# Patient Record
Sex: Male | Born: 1965 | Race: Black or African American | Hispanic: No | Marital: Married | Smoking: Current some day smoker
Health system: Southern US, Community
[De-identification: ages and names within clinical notes are randomized; demographics above are authoritative.]

## PROBLEM LIST (undated history)

## (undated) DIAGNOSIS — J45909 Unspecified asthma, uncomplicated: Secondary | ICD-10-CM

## (undated) DIAGNOSIS — K219 Gastro-esophageal reflux disease without esophagitis: Secondary | ICD-10-CM

## (undated) DIAGNOSIS — I10 Essential (primary) hypertension: Secondary | ICD-10-CM

## (undated) DIAGNOSIS — M1990 Unspecified osteoarthritis, unspecified site: Secondary | ICD-10-CM

## (undated) DIAGNOSIS — G8929 Other chronic pain: Secondary | ICD-10-CM

## (undated) DIAGNOSIS — M549 Dorsalgia, unspecified: Secondary | ICD-10-CM

## (undated) HISTORY — PX: KNEE ARTHROSCOPY: SUR90

## (undated) HISTORY — PX: COLONOSCOPY: SHX174

## (undated) HISTORY — PX: ROTATOR CUFF REPAIR: SHX139

## (undated) HISTORY — PX: BACK SURGERY: SHX140

---

## 2008-10-20 ENCOUNTER — Encounter: Admission: RE | Admit: 2008-10-20 | Discharge: 2008-10-20 | Payer: Self-pay | Admitting: Neurosurgery

## 2009-01-07 ENCOUNTER — Ambulatory Visit: Payer: Self-pay | Admitting: Vascular Surgery

## 2009-01-07 ENCOUNTER — Inpatient Hospital Stay (HOSPITAL_COMMUNITY): Admission: RE | Admit: 2009-01-07 | Discharge: 2009-01-11 | Payer: Self-pay | Admitting: Neurosurgery

## 2010-08-02 IMAGING — CR DG OR LOCAL ABDOMEN
1 series · 1 of 1 positions shown · non-contrast
Comparison: None

CLINICAL DATA: Anterior lumbar fusion.  No instrument, performed.

OR LOCAL ABDOMEN

[AP]
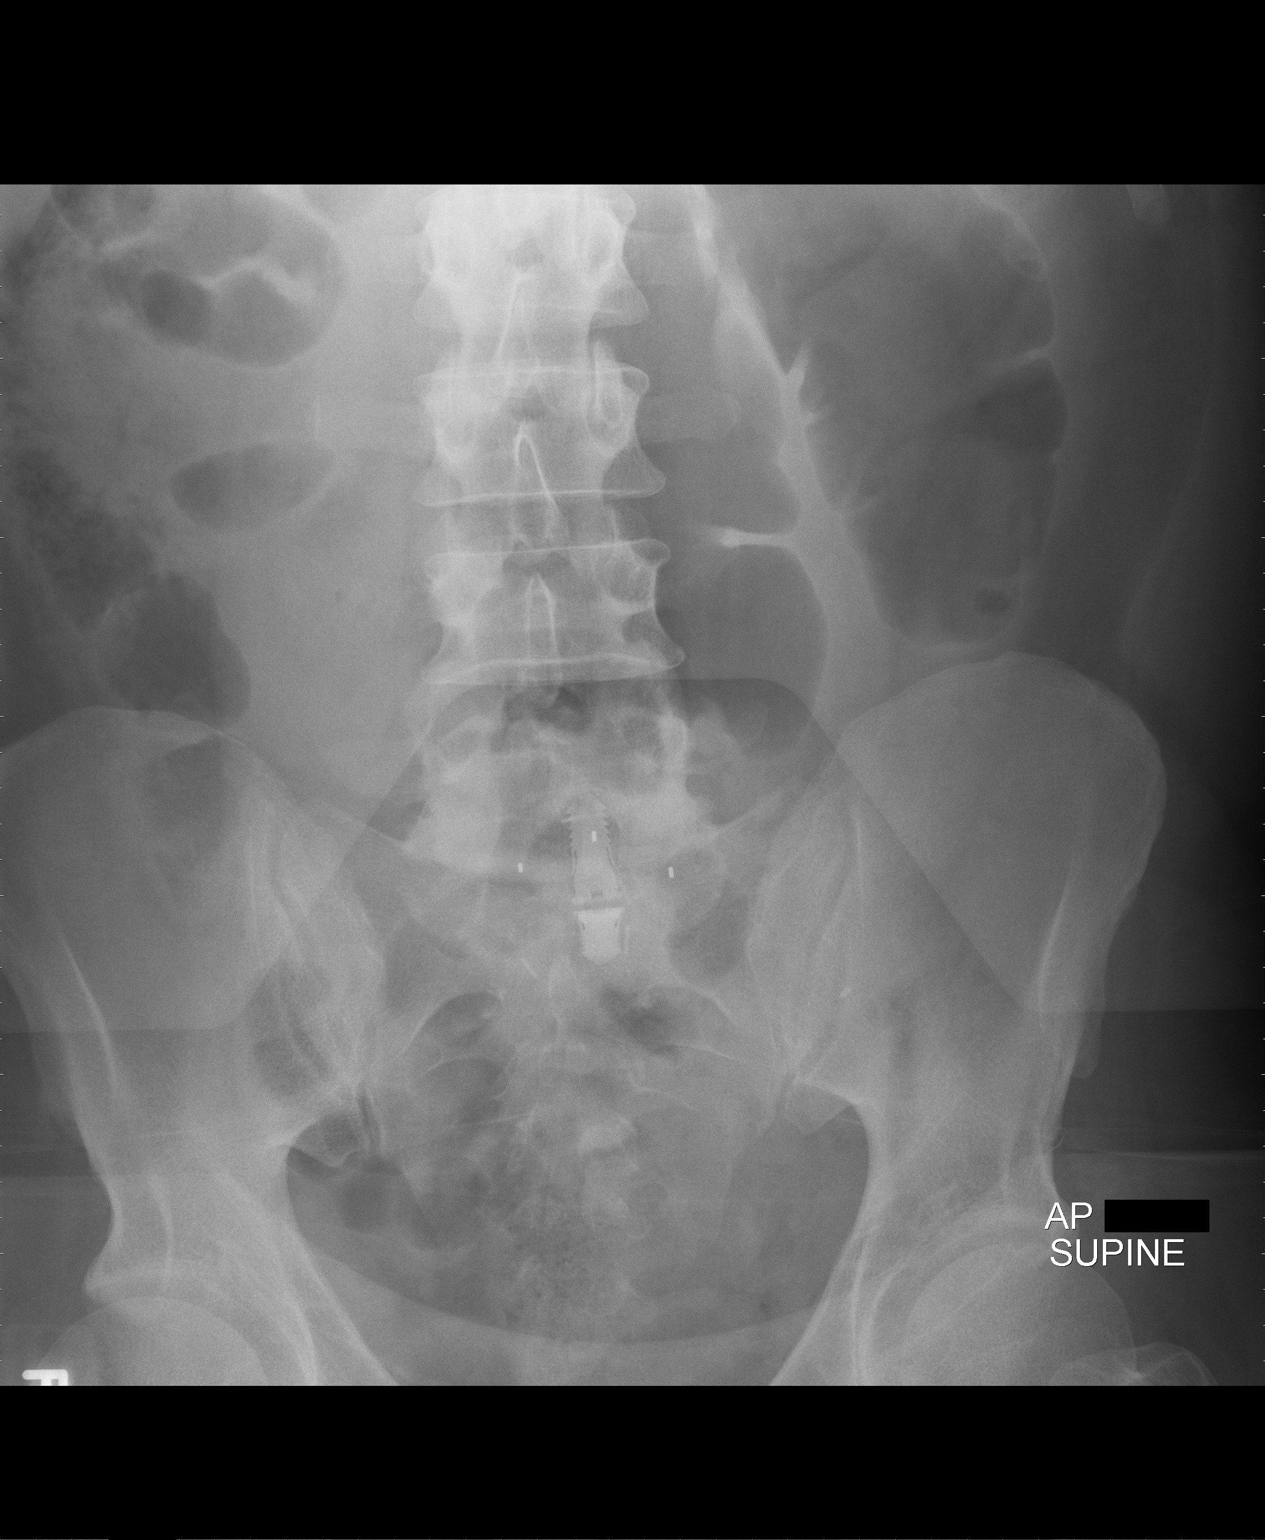

[1 of 1 positions shown; findings below may reference images not displayed]

FINDINGS: An GUTTILA PARLINA type lumbar fixation plate is present at the
L5-S1 level, with several vascular clips in the vicinity.

No radiopaque sponge marker or abnormal unexpected instrument is
identified within the visualized fields.
IMPRESSION: 1.  No unexpected foreign body identified.
2.  Anterior fusion at L5-S1.

## 2011-04-10 LAB — TYPE AND SCREEN
ABO/RH(D): O POS
Antibody Screen: NEGATIVE

## 2011-04-10 LAB — CBC
HCT: 45.8 % (ref 39.0–52.0)
MCV: 95.1 fL (ref 78.0–100.0)
Platelets: 241 10*3/uL (ref 150–400)

## 2011-05-09 NOTE — Op Note (Signed)
NAMEMALIKAI, GUT            ACCOUNT NO.:  [id number]   MEDICAL RECORD NO.:  [id number]          PATIENT TYPE:  INP   LOCATION:  3001                         FACILITY:  [facility]   PHYSICIAN:  Danae Orleans. Venetia Maxon, M.D.  DATE OF BIRTH:  1966/10/30   DATE OF PROCEDURE:  01/07/2009  DATE OF DISCHARGE:                               OPERATIVE REPORT   PREOPERATIVE DIAGNOSES:  Degenerative disk disease, spondylosis,  herniated lumbar disk, lumbar radiculopathy at L5-S1 level.   POSTOPERATIVE DIAGNOSES:  Degenerative disk disease, spondylosis,  herniated lumbar disk, lumbar radiculopathy at L5-S1 level.   PROCEDURE:  L5-S1 diskectomy, anterior lumbar interbody fusion with  interbody cage, plates, EquivaBone, and bone morphogenetic protein.   SURGEON:  Danae Orleans. Venetia Maxon, MD   ASSISTANT:  Georgiann Cocker, RN and Larina Earthly, MD   ANESTHESIA:  General endotracheal anesthesia.   ESTIMATED BLOOD LOSS:  Minimal.   COMPLICATIONS:  None.   DISPOSITION:  Recovery.   INDICATIONS:  Ricardo Yoder is a 45 year old man with a recurrent  disk herniation and significant disk degeneration at the L5-S1 level.  He has had three prior laminectomies at L5-S1 on the right.  He has  persistent right leg pain, disk space collapse, and significant  degenerative change at the L5-S1 level and he complains of significant  back pain greater than leg pain.  It was elected to take him to surgery  for anterior lumbar interbody fusion at the L5-S1 level.  Dr. Arbie Cookey was  doing opening and we will dictate his the approach for the surgery  separately.   PROCEDURE:  After Dr. Arbie Cookey had gained access to the anterior lumbar  interbody spine and placed the self-retaining retractors (I assisted him  throughout the opening from skin incision to placement of retractors).  The L5-S1 level was identified.  An 18 gauge spinal needle was inserted  and intraoperative x-ray demonstrated correct level and also appropriate  space  positioning in terms of the AP projection.  The interspace at L5-  S1 was then incised and using Cobb disk dissector, the entire disk was  removed.  The endplates were stripped of residual disk material using  sequential sizers and a variety of curettes and pituitary rongeurs and a  Kerrison rongeurs, the spinal canal was decompressed and posteriorly  projecting osteophytes were also removed.  Intraoperative x-ray with C-  arm demonstrated a hook posterior to the inferior S1 endplate and after  confirming with C-arm fluoroscopy, it was felt that the spinal canal and  both nerve roots were well decompressed and the posterior disk material  had been removed satisfactorily.  There was a prominence of the L5  endplate, which was shaved down with high-speed drill and after trial  sizing a 33 x 36 x 10 degrees x 12 mm in height, trial prosthesis was  inserted and tamped into position countersunk appropriately.  Positioning was confirmed on AP and lateral fluoroscopy and it was felt  that this spacer appeared to fit the interspace well and result in  appropriate level of endplate distraction.  Subsequently, a similarly  sized final implant was then selected,  packed with extra small BMP and  10 mL of EquivaBone, this was then inserted and countersunk  appropriately, positioning was confirmed on AP and lateral fluoroscopy.  Medium plates were then inserted one at L5, one at S1.  All plates were  well positioned and fully engaged.  The final x-ray both lateral and AP  projections demonstrated well-positioned interbody graft.  The  additional remaining EquivaBone was then inserted and countersunk  appropriately overlying the implant, which appeared to be well  positioned.  The retractors were then removed and Dr. Arbie Cookey returned for  closure and I assisted him with the closure.  This was done with 0  Vicryl running suture, reapproximating the anterior rectus sheath and a  subcuticular 3-0 Vicryl  stitch.  The wound was dressed with Dermabond.  The patient was extubated in the operating room and taken to recovery in  stable and satisfactory condition having tolerated the operation well.  Counts were correct at the end of the case.  A final x-ray was obtained  demonstrating no evidence of any retained foreign body.      Danae Orleans. Venetia Maxon, M.D.  Electronically Signed     JDS/MEDQ  D:  01/07/2009  T:  01/08/2009  Job:  [id number]

## 2011-05-09 NOTE — Op Note (Signed)
Ricardo Yoder, Ricardo Yoder            ACCOUNT NO.:  [id number]   MEDICAL RECORD NO.:  [id number]          PATIENT TYPE:  INP   LOCATION:  3001                         FACILITY:  [facility]   PHYSICIAN:  Larina Earthly, M.D.    DATE OF BIRTH:  [birth year]   DATE OF PROCEDURE:  01/07/2009  DATE OF DISCHARGE:                               OPERATIVE REPORT   PREOPERATIVE DIAGNOSIS:  L5-S1 disk disease.   POSTOPERATIVE DIAGNOSIS:  L5-S1 disk disease.   PROCEDURE:  Anterior exposure for anterior lumbar interbody fusion.   SURGEON:  Larina Earthly, MD   ASSISTANT:  Danae Orleans. Venetia Maxon, MD   ANESTHESIA:  General endotracheal.   COMPLICATIONS:  None.   DISPOSITION:  The patient will have remainder of the procedure dictated  by Dr. Venetia Maxon for the ALIF and the patient was transferred to recovery  room stable.   INDICATIONS:  The patient is a 45 year old gentleman with progressive  severe degenerative disk disease.  He was seen by Dr. Maeola Harman, who  recommended interbody fusion of L5-S1 as to provide anterior exposure.  I discussed this with [name] preoperatively in the holding  area.  I explained my role for mobilization of the vessels and ureter.  I discussed potential injuries to include arterial, venous, ureteral  injury.  Also, discussed the possible chance of retrograde ejaculation  and erectile dysfunction.  The patient understands and wish to proceed  with surgery.   PROCEDURE IN DETAIL:  The patient was taken to the operating room,  placed in supine position, where the area of the abdomen prepped and  draped in the usual sterile fashion.  With the C-arm in the lateral  position, the level of the L5-S1 disk space was marked on the skin.  An  incision was made transversely in the lower abdomen from the midline  lateral to the left.  This was continued deep through the subcutaneous  fat down to the level of the fascia.  The fat was mobilized off the  fascia and the anterior  rectus sheath was opened just to the left of  midline longitudinally with electrocautery.  The rectus muscle was  mobilized to the left.  The retroperitoneal space was entered below the  semilunar line with mobilization of the intraperitoneal contents to the  right.  The posterior rectus sheath was divided above the level of the  semilunar line laterally.  The ureter was identified and was mobilized  to the right as well.  The left iliac artery and left iliac vein were  mobilized.  Middle sacral vessels were clipped and divided.  This did  give exposure between the bifurcation of the iliac vessels for the L5-S1  disk space.  The tissue above the disk was mobilized to allow placement  of the retractor.  Once adequate mobilization was obtained, the Brau  retractor was secured to the bed and the 150 blades were positioned to  the right into the left of the L5-S1 body.  The diskectomy and interbody  fusion will be dictated as a separate note.  After completion of this,  the retractors  were removed.  The vessels and intraperitoneal contents  were again visualized.  There was no bleeding and no evidence of injury.  The peritoneal contents were allowed to fall back into the pelvis.  The  anterior rectus sheath was closed with a running 0 Vicryl suture  beginning proximally and distally and tying in the middle.  Wound was  irrigated with saline and hemostasis with electrocautery.  The skin was  closed with 3-0 subcuticular Vicryl stitch.  Dermabond was positioned  over the suture line.  The patient tolerated the procedure well.  No  immediate complication.  There had been a continuous monitoring of his  pulse in his foot and he had normal pulse at the completion of the  operation.      Larina Earthly, M.D.  Electronically Signed     TFE/MEDQ  D:  01/08/2009  T:  01/09/2009  Job:  [id number]   cc:   Danae Orleans. Venetia Maxon, M.D.

## 2011-05-12 NOTE — Discharge Summary (Signed)
Ricardo Yoder            ACCOUNT NO.:  [id number]   MEDICAL RECORD NO.:  [id number]          PATIENT TYPE:  INP   LOCATION:  3001                         FACILITY:  [facility]   PHYSICIAN:  Danae Orleans. Venetia Maxon, M.D.  DATE OF BIRTH:  [birth year]   DATE OF ADMISSION:  01/07/2009  DATE OF DISCHARGE:  01/11/2009                               DISCHARGE SUMMARY   REASON FOR ADMISSION:  Lumbosacral spondylosis, lumbosacral disk  degeneration, lumbar disk herniation, cellulitis of the trunk,  prophylactic vaccination against Strep pneumoniae, unspecified  constipation, tobacco use disorder, chronic use of steroids.   FINAL DIAGNOSES:  1. Lumbosacral spondylosis.  2. Lumbosacral disk degeneration.  3. Lumbar disk herniation.  4. Cellulitis of the trunk.  5. Prophylactic vaccination against Streptococcus pneumoniae.  6. Unspecified constipation.  7. Tobacco use disorder.  8. Chronic use of steroids.   HISTORY OF PRESENT ILLNESS AND HOSPITAL COURSE:  Ricardo Yoder is a  45 year old disabled man with low back pain and lumbar disk degeneration  at L5-S1.  He has a lateral disk protrusion associated with significant  degenerative changes at the L5-S1 level with extraforaminal and  foraminal right L5 nerve root encroachment.  He previously had lumbar  diskectomies at L5-S1 in 2002 and 2004.  It was elected to take him to  surgery for anterior lumbar decompression and fusion at the L5-S1 level.  This was done in conjunction with Dr. Arbie Cookey who provided exposure from  vascular surgery standpoint.  The patient did well after the surgery.  He was complaining of abdominal cramping, and he was found to have some  redness about his incision and was started on oral Keflex by Dr.  Franky Macho.  Subsequently, he was doing better with less pain and erythema  around the incision and was discharged home in stable satisfactory  condition having tolerated the operation and hospitalization well with  discharge  medications of Percocet and Valium with subcutaneous Lovenox  postoperatively.  Followup in 3 weeks in the office.      Danae Orleans. Venetia Maxon, M.D.  Electronically Signed     Danae Orleans. Venetia Maxon, M.D.  Electronically Signed    JDS/MEDQ  D:  04/01/2009  T:  04/02/2009  Job:  [id number]

## 2016-08-08 ENCOUNTER — Other Ambulatory Visit: Payer: Self-pay | Admitting: Neurosurgery

## 2016-08-30 ENCOUNTER — Encounter (HOSPITAL_COMMUNITY)
Admission: RE | Admit: 2016-08-30 | Discharge: 2016-08-30 | Disposition: A | Discharge status: Home / Self Care | Payer: Medicare Other | Source: Ambulatory Visit | Attending: Neurosurgery | Admitting: Neurosurgery

## 2016-08-30 ENCOUNTER — Encounter (HOSPITAL_COMMUNITY): Payer: Self-pay

## 2016-08-30 HISTORY — DX: Unspecified osteoarthritis, unspecified site: M19.90

## 2016-08-30 HISTORY — DX: Dorsalgia, unspecified: M54.9

## 2016-08-30 HISTORY — DX: Unspecified asthma, uncomplicated: J45.909

## 2016-08-30 HISTORY — DX: Other chronic pain: G89.29

## 2016-08-30 HISTORY — DX: Gastro-esophageal reflux disease without esophagitis: K21.9

## 2016-08-30 HISTORY — DX: Essential (primary) hypertension: I10

## 2016-08-30 LAB — BASIC METABOLIC PANEL WITH GFR
Anion gap: 6 (ref 5–15)
BUN: 13 mg/dL (ref 6–20)
CO2: 25 mmol/L (ref 22–32)
Calcium: 9.6 mg/dL (ref 8.9–10.3)
Chloride: 108 mmol/L (ref 101–111)
Creatinine, Ser: 1.28 mg/dL — ABNORMAL HIGH (ref 0.61–1.24)
GFR calc Af Amer: >60 mL/min
GFR calc non Af Amer: >60 mL/min
Glucose, Bld: 103 mg/dL — ABNORMAL HIGH (ref 65–99)
Potassium: 3.9 mmol/L (ref 3.5–5.1)
Sodium: 139 mmol/L (ref 135–145)

## 2016-08-30 LAB — SURGICAL PCR SCREEN
MRSA, PCR: NEGATIVE
Staphylococcus aureus: POSITIVE — AB

## 2016-08-30 LAB — TYPE AND SCREEN
ABO/RH(D): O POS
Antibody Screen: NEGATIVE

## 2016-08-30 LAB — CBC
HCT: 48.1 % (ref 39.0–52.0)
Hemoglobin: 16.3 g/dL (ref 13.0–17.0)
MCH: 32.3 pg (ref 26.0–34.0)
MCHC: 33.9 g/dL (ref 30.0–36.0)
MCV: 95.2 fL (ref 78.0–100.0)
Platelets: 231 10*3/uL (ref 150–400)
RBC: 5.05 MIL/uL (ref 4.22–5.81)
RDW: 13.1 % (ref 11.5–15.5)
WBC: 11.2 10*3/uL — ABNORMAL HIGH (ref 4.0–10.5)

## 2016-08-30 NOTE — Pre-Procedure Instructions (Addendum)
Ricardo Yoder  08/30/2016     Your procedure is scheduled on Friday, September 01, 2016 at 2:00 PM.   Report to [facility] Entrance "A" Admitting Office at 11:00 AM.   Call this number if you have problems the morning of surgery: [phone]   Questions prior to day of surgery, please call [phone] between 8 & 4 PM.   Remember:  Do not eat food or drink liquids after midnight Thursday, 08/31/16.  Take these medicines the morning of surgery with A SIP OF WATER: Amlodipine (Norvasc), Omeprazole (Prilosec), Oxycodone - if needed.   Do not wear jewelry.  Do not wear lotions, powders, or cologne.  Men may shave face and neck.  Do not bring valuables to the hospital.  [facility] is not responsible for any belongings or valuables.  Contacts, dentures or bridgework may not be worn into surgery.  Leave your suitcase in the car.  After surgery it may be brought to your room.  For patients admitted to the hospital, discharge time will be determined by your treatment team.  Special instructions:  [place] - Preparing for Surgery  Before surgery, you can play an important role.  Because skin is not sterile, your skin needs to be as free of germs as possible.  You can reduce the number of germs on you skin by washing with CHG (chlorahexidine gluconate) soap before surgery.  CHG is an antiseptic cleaner which kills germs and bonds with the skin to continue killing germs even after washing.  Please DO NOT use if you have an allergy to CHG or antibacterial soaps.  If your skin becomes reddened/irritated stop using the CHG and inform your nurse when you arrive at Short Stay.  Do not shave (including legs and underarms) for at least 48 hours prior to the first CHG shower.  You may shave your face.  Please follow these instructions carefully:   1.  Shower with CHG Soap the night before surgery and the                    morning of Surgery.  2.  If you choose to wash your hair,  wash your hair first as usual with your       normal shampoo.  3.  After you shampoo, rinse your hair and body thoroughly to remove the shampoo.  4.  Use CHG as you would any other liquid soap.  You can apply chg directly       to the skin and wash gently with scrungie or a clean washcloth.  5.  Apply the CHG Soap to your body ONLY FROM THE NECK DOWN.        Do not use on open wounds or open sores.  Avoid contact with your eyes, ears, mouth and genitals (private parts).  Wash genitals (private parts) with your normal soap.  6.  Wash thoroughly, paying special attention to the area where your surgery        will be performed.  7.  Thoroughly rinse your body with warm water from the neck down.  8.  DO NOT shower/wash with your normal soap after using and rinsing off       the CHG Soap.  9.  Pat yourself dry with a clean towel.            10.  Wear clean pajamas.            11.  Place clean sheets  on your bed the night of your first shower and do not        sleep with pets.  Day of Surgery  Do not apply any lotions the morning of surgery.  Please wear clean clothes to the hospital.   Please read over the following fact sheets that you were given. Pain Booklet, Coughing and Deep Breathing, MRSA Information and Surgical Site Infection Prevention

## 2016-08-30 NOTE — Progress Notes (Signed)
Pt denies cardiac history, chest pain or sob. 

## 2016-08-30 NOTE — Progress Notes (Signed)
   08/30/16 [id number]  OBSTRUCTIVE SLEEP APNEA  Have you ever been diagnosed with sleep apnea through a sleep study? No  Do you snore loudly (loud enough to be heard through closed doors)?  1  Do you often feel tired, fatigued, or sleepy during the daytime (such as falling asleep during driving or talking to someone)? 0  Has anyone observed you stop breathing during your sleep? 1  Do you have, or are you being treated for high blood pressure? 1  BMI more than 35 kg/m2? 0  Age > 50 (1-yes) 1  Neck circumference greater than:Male 16 inches or larger, Male 17inches or larger? 1  Male Gender (Yes=1) 1  Obstructive Sleep Apnea Score 6  Score 5 or greater  Results sent to PCP

## 2016-08-30 NOTE — Progress Notes (Signed)
Mupirocin Ointment Rx called into [facility] for positive PCR of Staph. Left message on pt's voicemail informing him of results and need to pick up Rx.

## 2016-08-31 MED ORDER — CEFAZOLIN SODIUM-DEXTROSE 2-4 GM/100ML-% IV SOLN
2.0000 g | INTRAVENOUS | Status: AC
  Administered 2016-09-01: 3 g via INTRAVENOUS
  Filled 2016-08-31: qty 100

## 2016-08-31 NOTE — H&P (Signed)
Patient ID:   [id number] Patient: Ricardo Dissobert Naas  Date of Birth: 1966/02/26 Visit Type: Office Visit   Date: 08/07/2016 09:30 AM Provider: Danae OrleansJoseph D. Venetia MaxonStern MD   This 50 year old male presents for back pain.  History of Present Illness: 1.  back pain    03/23/16 ESI L3-L4 by Dr. Ollen BowlHarkins  Patient reports no pain relief after injection.  Reports persistent low back and left leg numbness and pain with increased right leg numbness and pain. Poor sleep. He continues to smoke 5-6 cigarettes a day and has gained weight since his last visit.   Percocet 10/325 increased recently by patient to every 4-6 hours due to increased pain  (prescribed by Dr. Mayford KnifeWilliams typically #90 per month)      Medical/Surgical/Interim History Reviewed, no change.  Last detailed document date:01/31/2016.   PAST MEDICAL HISTORY, SURGICAL HISTORY, FAMILY HISTORY, SOCIAL HISTORY AND REVIEW OF SYSTEMS  03/23/2016, which I have signed.  Family History: Reviewed, no changes.  Last detailed document: 01/31/2016.   Social History: Tobacco use reviewed. Reviewed, no changes. Last detailed document date: 01/31/2016.      MEDICATIONS(added, continued or stopped this visit): Started Medication Directions Instruction Stopped   amlodipine 10 mg tablet take 1 tablet by oral route  every day     omeprazole 40 mg capsule,delayed release take 1 capsule by oral route  every day before a meal     Percocet 10 mg-325 mg tablet take 1 tablet by oral route  every 8 hours as needed       ALLERGIES: Ingredient Reaction Medication Name Comment  NO KNOWN ALLERGIES     No known allergies.    Vitals Date Temp F BP Pulse Ht In Wt Lb BMI BSA Pain Score  08/07/2016  133/89 80 74 259 33.25  10/10      IMPRESSION The patient did not get any relief from his epidural injection and continues to have low back and bilateral leg pain/numbness. Since the injection offered no relief and he is in miserable pain, the only other  option at this point is surgery. I recommend a L3-4, L4-5 MAS PLIF for alleviation of his low back and bilateral leg symptoms.  Completed Orders (this encounter) Order Details Reason Side Interpretation Result Initial Treatment Date Region  Dietary management education, guidance, and counseling Encouraged to eat a well balanced diet and follow up with primary care physician.         Assessment/Plan # Detail Type Description   1. Assessment Body mass index (BMI) 33.0-33.9, adult (F62.13(Z68.33).   Plan Orders Today's instructions / counseling include(s) Dietary management education, guidance, and counseling.       2. Assessment Spondylolisthesis, lumbar region (M43.16).   Plan Orders Aspen Lo Sag Rigid Panel Quick.       3. Assessment Radiculopathy, lumbar region (M54.16).       4. Assessment Spinal stenosis of lumbar region (M48.06).         Pain Assessment/Treatment Pain Scale: 10/10. Method: Numeric Pain Intensity Scale. Location: back, legs. Onset: 05/31/2015. Duration: varies. Quality: discomforting. Pain Assessment/Treatment follow-up plan of care: Patient taking medications as prescribed..  Fall Risk Plan The patient has not fallen in the last year.  We discussed smoking cessation and the importance of weight loss. Schedule L3-4, L4-5 MAS PLIF. Nurse education given. Fitted for LSO brace.  Orders: Diagnostic Procedures: Assessment Procedure  M54.16 Lumbar Spine- AP/Lat  Instruction(s)/Education: Assessment Instruction  [phone] Dietary management education, guidance, and counseling  Miscellaneous: Assessment  M43.16 Aspen Lo Sag Rigid Panel Quick             Provider:  Danae Orleans. Venetia Maxon MD  08/07/2016 10:43 AM Dictation edited by: Gabriel Rainwater    CC Providers: Lerry Liner [facility] [address] [place], [place] [address]-              Electronically signed by Danae Orleans. Venetia Maxon MD on 08/07/2016 06:13 PM  Patient ID:    [id number] Patient: Ricardo Yoder  Date of Birth: 1966-06-23 Visit Type: Office Visit   Date: 03/08/2016 10:00 AM Provider: Danae Orleans. Venetia Maxon MD   This 50 year old male presents for back pain.  History of Present Illness: 1.  back pain  Patient here to discuss 02/23/16 MRI results.   Ricardo Yoder, 50 year old disabled male returns reporting low back and intermittent bilateral leg pain x 9 months.  He recalls no injury.  History: HTN, GERD Surgical history: L5-S1 ALIF by Dr. Venetia Maxon ?2012; ?  Knee surgery,?  Left shoulder surgery  The patient is currently 62 inches tall and weighs 260 pounds.  He smokes a half pack per day cigarettes.  He complains of low back pain and left greater and right lower extremity pain.  He has a healed fusion at the L5-S1 level. Pain has not improved since last visit, mostly back pain. States pain injections alleviated for 4-6 months but came soon after.   02/23/16 MRI: Lumbar spondylosis, congenitally short pedicles, and degenerative disc disease, causing prominent impingement at L3-4 and L4-5. That noted, the impingement at L4-5 is improved compared to the prior exam, but the impingement at L3-4 is mildly worsened.the degree of stenosis is significant and the patient has retrolisthesis of L3 on L4 and L5.  His right-sided pain is almost certainly from the L3 L4 disc herniation with stenosis and retrolisthesis and his L4 L5 level is still problematic.  BMI= 34 should be at 190lbs      Medical/Surgical/Interim History Reviewed, no change.  Last detailed document date:01/31/2016.   Family History: Reviewed, no changes.  Last detailed document: 01/31/2016.   Social History: Reviewed, no changes. Last detailed document date: 01/31/2016.      MEDICATIONS(added, continued or stopped this visit): Started Medication Directions Instruction Stopped   amlodipine 10 mg tablet take 1 tablet by oral route  every day     omeprazole 40 mg capsule,delayed  release take 1 capsule by oral route  every day before a meal     Percocet 10 mg-325 mg tablet take 1 tablet by oral route  every 8 hours as needed       ALLERGIES: Ingredient Reaction Medication Name Comment  NO KNOWN ALLERGIES     No known allergies.    Vitals Date Temp F BP Pulse Ht In Wt Lb BMI BSA Pain Score  03/08/2016  145/94 76 74 262 33.64  10/10     DIAGNOSTIC RESULTS 02/23/16 MRI: Lumbar spondylosis, congenitally short pedicles, and degenerative disc disease, causing prominent impingement at L3-4 and L4-5. That noted, the impingement at L4-5 is improved compared to the prior exam, but the impingement at L3-4 is mildly worsened.    IMPRESSION The patient is experiencing continued lower back pain and bilateral leg pain. He did have success from previous pain injections, so he may benefit from additional injections (ESI vs SNRB L3-4 > L4-5). He wants to proceed with injections right now rather than surgery.  Completed Orders (this encounter) Order Details Reason Side Interpretation  Result Initial Treatment Date Region  Lifestyle education regarding diet Patient encouraged to eat a well balenced diet.        Lifestyle education Patient to follow up with primary care provider         Assessment/Plan # Detail Type Description   1. Assessment Body mass index (BMI) 33.0-33.9, adult (B14.78).   Plan Orders Today's instructions / counseling include(s) Lifestyle education regarding diet.       2. Assessment Essential (primary) hypertension (I10).         Pain Assessment/Treatment Pain Scale: 10/10. Method: Numeric Pain Intensity Scale. Location: back. Onset: 05/31/2015. Duration: varies. Quality: discomforting. Pain Assessment/Treatment follow-up plan of care: Patient taking medication as prescribed.  Fall Risk Plan The patient has not fallen in the last year. Falls risk follow-up plan of care: Assisted devices: Advise to use safety measures when available.  ESI  vs SNRB L3-4 > L4-5 with Dr. Ollen Bowl. Advised for weight loss to 190 lb, smoking cessation. Follow up after injections.  Orders: Instruction(s)/Education: Assessment Instruction  I10 Lifestyle education  [phone] Lifestyle education regarding diet             Provider:  Danae Orleans. Venetia Maxon MD  03/08/2016 11:16 AM Dictation edited by: Jamey Ripa    CC Providers: Lerry Liner [facility] [address] [place], [place] [address]-              Electronically signed by Danae Orleans. Venetia Maxon MD on 03/08/2016 11:22 AM

## 2016-08-31 NOTE — Interval H&P Note (Signed)
History and Physical Interval Note:  08/31/2016 4:58 PM  Ricardo Yoder  has presented today for surgery, with the diagnosis of SPONDYLOLISTHESIS, LUMBAR REGION  The various methods of treatment have been discussed with the patient and family. After consideration of risks, benefits and other options for treatment, the patient has consented to  Procedure(s) with comments: MAXIMUM ACCESS SURGERY POSTERIOR LUMBAR INTERBODY FUSION L3-L4, L4-L5 (N/A) - MAXIMUM ACCESS SURGERY POSTERIOR LUMBAR INTERBODY FUSION L3-L4, L4-L5 as a surgical intervention .  The patient's history has been reviewed, patient examined, no change in status, stable for surgery.  I have reviewed the patient's chart and labs.  Questions were answered to the patient's satisfaction.     Gricel Copen D

## 2016-09-01 ENCOUNTER — Inpatient Hospital Stay (HOSPITAL_COMMUNITY): Payer: Medicare Other

## 2016-09-01 ENCOUNTER — Inpatient Hospital Stay (HOSPITAL_COMMUNITY): Payer: Medicare Other | Admitting: Certified Registered Nurse Anesthetist

## 2016-09-01 ENCOUNTER — Inpatient Hospital Stay (HOSPITAL_COMMUNITY)
Admission: RE | Disposition: A | Discharge status: Home / Self Care | Payer: Self-pay | Source: Ambulatory Visit | Attending: Neurosurgery

## 2016-09-01 ENCOUNTER — Inpatient Hospital Stay (HOSPITAL_COMMUNITY)
Admission: RE | Admit: 2016-09-01 | Discharge: 2016-09-05 | DRG: 460 | Disposition: A | Discharge status: Home / Self Care | Payer: Medicare Other | Source: Ambulatory Visit | Attending: Neurosurgery | Admitting: Neurosurgery

## 2016-09-01 ENCOUNTER — Encounter (HOSPITAL_COMMUNITY): Payer: Self-pay | Admitting: Surgery

## 2016-09-01 DIAGNOSIS — M5116 Intervertebral disc disorders with radiculopathy, lumbar region: Principal | ICD-10-CM | POA: Diagnosis present

## 2016-09-01 DIAGNOSIS — F17210 Nicotine dependence, cigarettes, uncomplicated: Secondary | ICD-10-CM | POA: Diagnosis not present

## 2016-09-01 DIAGNOSIS — Z79899 Other long term (current) drug therapy: Secondary | ICD-10-CM | POA: Diagnosis not present

## 2016-09-01 DIAGNOSIS — E669 Obesity, unspecified: Secondary | ICD-10-CM | POA: Diagnosis not present

## 2016-09-01 DIAGNOSIS — M4726 Other spondylosis with radiculopathy, lumbar region: Secondary | ICD-10-CM | POA: Diagnosis not present

## 2016-09-01 DIAGNOSIS — M4806 Spinal stenosis, lumbar region: Secondary | ICD-10-CM | POA: Diagnosis not present

## 2016-09-01 DIAGNOSIS — M545 Low back pain: Secondary | ICD-10-CM | POA: Diagnosis present

## 2016-09-01 DIAGNOSIS — K219 Gastro-esophageal reflux disease without esophagitis: Secondary | ICD-10-CM | POA: Diagnosis not present

## 2016-09-01 DIAGNOSIS — I10 Essential (primary) hypertension: Secondary | ICD-10-CM | POA: Diagnosis present

## 2016-09-01 DIAGNOSIS — Z6833 Body mass index (BMI) 33.0-33.9, adult: Secondary | ICD-10-CM

## 2016-09-01 DIAGNOSIS — Z419 Encounter for procedure for purposes other than remedying health state, unspecified: Secondary | ICD-10-CM

## 2016-09-01 DIAGNOSIS — M4316 Spondylolisthesis, lumbar region: Secondary | ICD-10-CM | POA: Diagnosis present

## 2016-09-01 SURGERY — POSTERIOR LUMBAR FUSION 1 LEVEL
Anesthesia: General | Site: Back

## 2016-09-01 MED ORDER — FENTANYL CITRATE (PF) 100 MCG/2ML IJ SOLN
25.0000 ug | INTRAMUSCULAR | Status: DC | PRN
  Administered 2016-09-01 (×3): 50 ug via INTRAVENOUS

## 2016-09-01 MED ORDER — DOCUSATE SODIUM 100 MG PO CAPS
100.0000 mg | ORAL_CAPSULE | Freq: Two times a day (BID) | ORAL | Status: DC
  Administered 2016-09-01 – 2016-09-05 (×7): 100 mg via ORAL
  Filled 2016-09-01 (×7): qty 1

## 2016-09-01 MED ORDER — PHENYLEPHRINE 40 MCG/ML (10ML) SYRINGE FOR IV PUSH (FOR BLOOD PRESSURE SUPPORT)
PREFILLED_SYRINGE | INTRAVENOUS | Status: AC
  Filled 2016-09-01: qty 10

## 2016-09-01 MED ORDER — FENTANYL CITRATE (PF) 100 MCG/2ML IJ SOLN
INTRAMUSCULAR | Status: AC
  Filled 2016-09-01: qty 2

## 2016-09-01 MED ORDER — METHOCARBAMOL 500 MG PO TABS
500.0000 mg | ORAL_TABLET | Freq: Four times a day (QID) | ORAL | Status: DC | PRN
  Administered 2016-09-01 – 2016-09-05 (×7): 500 mg via ORAL
  Filled 2016-09-01 (×6): qty 1

## 2016-09-01 MED ORDER — SUCCINYLCHOLINE CHLORIDE 200 MG/10ML IV SOSY
PREFILLED_SYRINGE | INTRAVENOUS | Status: AC
  Filled 2016-09-01: qty 10

## 2016-09-01 MED ORDER — ARTIFICIAL TEARS OP OINT
TOPICAL_OINTMENT | OPHTHALMIC | Status: DC | PRN
  Administered 2016-09-01: 1 via OPHTHALMIC

## 2016-09-01 MED ORDER — LACTATED RINGERS IV SOLN
INTRAVENOUS | Status: DC | PRN
  Administered 2016-09-01 (×3): via INTRAVENOUS

## 2016-09-01 MED ORDER — FENTANYL CITRATE (PF) 100 MCG/2ML IJ SOLN
INTRAMUSCULAR | Status: DC
Start: 2016-09-01 — End: 2016-09-01
  Filled 2016-09-01: qty 2

## 2016-09-01 MED ORDER — ONDANSETRON HCL 4 MG/2ML IJ SOLN
4.0000 mg | INTRAMUSCULAR | Status: DC | PRN
Start: 2016-09-01 — End: 2016-09-05

## 2016-09-01 MED ORDER — METHOCARBAMOL 1000 MG/10ML IJ SOLN
500.0000 mg | Freq: Four times a day (QID) | INTRAVENOUS | Status: DC | PRN
  Filled 2016-09-01: qty 5

## 2016-09-01 MED ORDER — PHENYLEPHRINE HCL 10 MG/ML IJ SOLN
INTRAMUSCULAR | Status: DC | PRN
  Administered 2016-09-01 (×5): 80 ug via INTRAVENOUS

## 2016-09-01 MED ORDER — SODIUM CHLORIDE 0.9% FLUSH: 3.0000 mL | INTRAVENOUS | Status: DC | PRN

## 2016-09-01 MED ORDER — DEXMEDETOMIDINE BOLUS VIA INFUSION: 1.0000 ug/kg | Freq: Once | INTRAVENOUS | Status: DC

## 2016-09-01 MED ORDER — MIDAZOLAM HCL 2 MG/2ML IJ SOLN
INTRAMUSCULAR | Status: AC
  Filled 2016-09-01: qty 2

## 2016-09-01 MED ORDER — LIDOCAINE 2% (20 MG/ML) 5 ML SYRINGE
INTRAMUSCULAR | Status: AC
  Filled 2016-09-01: qty 5

## 2016-09-01 MED ORDER — ALBUMIN HUMAN 5 % IV SOLN
INTRAVENOUS | Status: DC | PRN
  Administered 2016-09-01: 15:00:00 via INTRAVENOUS

## 2016-09-01 MED ORDER — DEXMEDETOMIDINE HCL IN NACL 200 MCG/50ML IV SOLN
INTRAVENOUS | Status: AC
  Filled 2016-09-01: qty 50

## 2016-09-01 MED ORDER — FAMOTIDINE IN NACL 20-0.9 MG/50ML-% IV SOLN
20.0000 mg | Freq: Two times a day (BID) | INTRAVENOUS | Status: DC
  Administered 2016-09-01 – 2016-09-02 (×2): 20 mg via INTRAVENOUS
  Filled 2016-09-01 (×2): qty 50

## 2016-09-01 MED ORDER — KETAMINE HCL-SODIUM CHLORIDE 100-0.9 MG/10ML-% IV SOSY
PREFILLED_SYRINGE | INTRAVENOUS | Status: AC
  Filled 2016-09-01: qty 10

## 2016-09-01 MED ORDER — HYDROMORPHONE HCL 1 MG/ML IJ SOLN
INTRAMUSCULAR | Status: AC
  Administered 2016-09-01: 1 mg via INTRAVENOUS
  Filled 2016-09-01: qty 1

## 2016-09-01 MED ORDER — HYDROMORPHONE HCL 1 MG/ML IJ SOLN
1.0000 mg | INTRAMUSCULAR | Status: DC | PRN
  Administered 2016-09-01 – 2016-09-02 (×3): 2 mg via INTRAVENOUS
  Administered 2016-09-02 – 2016-09-03 (×3): 1 mg via INTRAVENOUS
  Filled 2016-09-01: qty 1
  Filled 2016-09-01: qty 2
  Filled 2016-09-01 (×2): qty 1
  Filled 2016-09-01 (×2): qty 2

## 2016-09-01 MED ORDER — VANCOMYCIN HCL 1000 MG IV SOLR
INTRAVENOUS | Status: AC
  Filled 2016-09-01: qty 1000

## 2016-09-01 MED ORDER — FENTANYL CITRATE (PF) 100 MCG/2ML IJ SOLN
INTRAMUSCULAR | Status: DC | PRN
  Administered 2016-09-01 (×3): 50 ug via INTRAVENOUS
  Administered 2016-09-01: 100 ug via INTRAVENOUS
  Administered 2016-09-01: 50 ug via INTRAVENOUS
  Administered 2016-09-01: 100 ug via INTRAVENOUS
  Administered 2016-09-01 (×2): 50 ug via INTRAVENOUS

## 2016-09-01 MED ORDER — PHENOL 1.4 % MT LIQD: 1.0000 | OROMUCOSAL | Status: DC | PRN

## 2016-09-01 MED ORDER — OXYCODONE-ACETAMINOPHEN 5-325 MG PO TABS
ORAL_TABLET | ORAL | Status: AC
  Administered 2016-09-01: 2 via ORAL
  Filled 2016-09-01: qty 2

## 2016-09-01 MED ORDER — KCL IN DEXTROSE-NACL 20-5-0.45 MEQ/L-%-% IV SOLN
INTRAVENOUS | Status: DC
  Administered 2016-09-01: 21:00:00 via INTRAVENOUS
  Filled 2016-09-01: qty 1000

## 2016-09-01 MED ORDER — BUPIVACAINE LIPOSOME 1.3 % IJ SUSP
INTRAMUSCULAR | Status: DC | PRN
  Administered 2016-09-01: 20 mL

## 2016-09-01 MED ORDER — HYDROMORPHONE HCL 1 MG/ML IJ SOLN
0.5000 mg | INTRAMUSCULAR | Status: DC | PRN
  Administered 2016-09-01: 1 mg via INTRAVENOUS

## 2016-09-01 MED ORDER — PROPOFOL 10 MG/ML IV BOLUS
INTRAVENOUS | Status: AC
  Filled 2016-09-01: qty 20

## 2016-09-01 MED ORDER — SUCCINYLCHOLINE CHLORIDE 20 MG/ML IJ SOLN
INTRAMUSCULAR | Status: DC | PRN
  Administered 2016-09-01: 140 mg via INTRAVENOUS

## 2016-09-01 MED ORDER — CHLORHEXIDINE GLUCONATE CLOTH 2 % EX PADS: 6.0000 | MEDICATED_PAD | Freq: Once | CUTANEOUS | Status: DC

## 2016-09-01 MED ORDER — PROPOFOL 10 MG/ML IV BOLUS
INTRAVENOUS | Status: DC | PRN
  Administered 2016-09-01 (×3): 50 mg via INTRAVENOUS
  Administered 2016-09-01: 200 mg via INTRAVENOUS
  Administered 2016-09-01 (×2): 50 mg via INTRAVENOUS

## 2016-09-01 MED ORDER — SENNOSIDES-DOCUSATE SODIUM 8.6-50 MG PO TABS
1.0000 | ORAL_TABLET | Freq: Every evening | ORAL | Status: DC | PRN
  Administered 2016-09-04: 1 via ORAL
  Filled 2016-09-01: qty 1

## 2016-09-01 MED ORDER — ARTIFICIAL TEARS OP OINT
TOPICAL_OINTMENT | OPHTHALMIC | Status: AC
  Filled 2016-09-01: qty 3.5

## 2016-09-01 MED ORDER — HYDROCODONE-ACETAMINOPHEN 5-325 MG PO TABS
1.0000 | ORAL_TABLET | ORAL | Status: DC | PRN
  Administered 2016-09-02: 2 via ORAL
  Filled 2016-09-01 (×2): qty 2

## 2016-09-01 MED ORDER — KETAMINE HCL 10 MG/ML IJ SOLN
INTRAMUSCULAR | Status: DC | PRN
  Administered 2016-09-01 (×2): 25 mg via INTRAVENOUS

## 2016-09-01 MED ORDER — ACETAMINOPHEN 325 MG PO TABS
650.0000 mg | ORAL_TABLET | ORAL | Status: DC | PRN
  Administered 2016-09-04: 650 mg via ORAL
  Filled 2016-09-01: qty 2

## 2016-09-01 MED ORDER — BISACODYL 10 MG RE SUPP
10.0000 mg | Freq: Every day | RECTAL | Status: DC | PRN
  Administered 2016-09-03: 10 mg via RECTAL
  Filled 2016-09-01: qty 1

## 2016-09-01 MED ORDER — SODIUM CHLORIDE 0.9 % IV SOLN: 250.0000 mL | INTRAVENOUS | Status: DC

## 2016-09-01 MED ORDER — ACETAMINOPHEN 650 MG RE SUPP: 650.0000 mg | RECTAL | Status: DC | PRN

## 2016-09-01 MED ORDER — MIDAZOLAM HCL 5 MG/5ML IJ SOLN
INTRAMUSCULAR | Status: DC | PRN
  Administered 2016-09-01 (×2): 1 mg via INTRAVENOUS

## 2016-09-01 MED ORDER — ALUM & MAG HYDROXIDE-SIMETH 200-200-20 MG/5ML PO SUSP: 30.0000 mL | Freq: Four times a day (QID) | ORAL | Status: DC | PRN

## 2016-09-01 MED ORDER — ROCURONIUM BROMIDE 10 MG/ML (PF) SYRINGE
PREFILLED_SYRINGE | INTRAVENOUS | Status: AC
  Filled 2016-09-01: qty 10

## 2016-09-01 MED ORDER — MIDAZOLAM HCL 2 MG/2ML IJ SOLN
2.0000 mg | Freq: Once | INTRAMUSCULAR | Status: AC
  Administered 2016-09-01: 2 mg via INTRAVENOUS

## 2016-09-01 MED ORDER — DIAZEPAM 5 MG PO TABS
5.0000 mg | ORAL_TABLET | Freq: Four times a day (QID) | ORAL | Status: DC | PRN
  Administered 2016-09-01 – 2016-09-03 (×5): 10 mg via ORAL
  Filled 2016-09-01 (×5): qty 2

## 2016-09-01 MED ORDER — PANTOPRAZOLE SODIUM 40 MG PO TBEC
40.0000 mg | DELAYED_RELEASE_TABLET | Freq: Every day | ORAL | Status: DC
  Administered 2016-09-02 – 2016-09-05 (×4): 40 mg via ORAL
  Filled 2016-09-01 (×4): qty 1

## 2016-09-01 MED ORDER — LACTATED RINGERS IV SOLN
INTRAVENOUS | Status: DC
  Administered 2016-09-01: 12:00:00 via INTRAVENOUS

## 2016-09-01 MED ORDER — SODIUM CHLORIDE 0.9% FLUSH
3.0000 mL | Freq: Two times a day (BID) | INTRAVENOUS | Status: DC
  Administered 2016-09-02 – 2016-09-04 (×4): 3 mL via INTRAVENOUS

## 2016-09-01 MED ORDER — FLEET ENEMA 7-19 GM/118ML RE ENEM: 1.0000 | ENEMA | Freq: Once | RECTAL | Status: DC | PRN

## 2016-09-01 MED ORDER — LIDOCAINE HCL (CARDIAC) 20 MG/ML IV SOLN
INTRAVENOUS | Status: DC | PRN
  Administered 2016-09-01: 40 mg via INTRAVENOUS

## 2016-09-01 MED ORDER — PHENYLEPHRINE HCL 10 MG/ML IJ SOLN
INTRAVENOUS | Status: DC | PRN
  Administered 2016-09-01: 20 ug/min via INTRAVENOUS

## 2016-09-01 MED ORDER — ONDANSETRON HCL 4 MG/2ML IJ SOLN
4.0000 mg | Freq: Once | INTRAMUSCULAR | Status: DC | PRN
Start: 2016-09-01 — End: 2016-09-01

## 2016-09-01 MED ORDER — CEFAZOLIN IN D5W 1 GM/50ML IV SOLN
1.0000 g | Freq: Three times a day (TID) | INTRAVENOUS | Status: AC
  Administered 2016-09-01 – 2016-09-02 (×2): 1 g via INTRAVENOUS
  Filled 2016-09-01 (×3): qty 50

## 2016-09-01 MED ORDER — OXYCODONE-ACETAMINOPHEN 10-325 MG PO TABS: 0.5000 | ORAL_TABLET | ORAL | Status: DC | PRN

## 2016-09-01 MED ORDER — BUPIVACAINE LIPOSOME 1.3 % IJ SUSP
20.0000 mL | INTRAMUSCULAR | Status: DC
  Filled 2016-09-01: qty 20

## 2016-09-01 MED ORDER — MENTHOL 3 MG MT LOZG: 1.0000 | LOZENGE | OROMUCOSAL | Status: DC | PRN

## 2016-09-01 MED ORDER — THROMBIN 20000 UNITS EX SOLR
CUTANEOUS | Status: DC | PRN
  Administered 2016-09-01: 20 mL via TOPICAL

## 2016-09-01 MED ORDER — 0.9 % SODIUM CHLORIDE (POUR BTL) OPTIME
TOPICAL | Status: DC | PRN
  Administered 2016-09-01: 1000 mL

## 2016-09-01 MED ORDER — ONDANSETRON HCL 4 MG/2ML IJ SOLN
INTRAMUSCULAR | Status: AC
  Filled 2016-09-01: qty 2

## 2016-09-01 MED ORDER — METHOCARBAMOL 500 MG PO TABS
ORAL_TABLET | ORAL | Status: AC
  Administered 2016-09-01: 500 mg via ORAL
  Filled 2016-09-01: qty 1

## 2016-09-01 MED ORDER — BUPIVACAINE-EPINEPHRINE (PF) 0.5% -1:200000 IJ SOLN
INTRAMUSCULAR | Status: DC | PRN
  Administered 2016-09-01: 10 mL via PERINEURAL

## 2016-09-01 MED ORDER — OXYCODONE-ACETAMINOPHEN 5-325 MG PO TABS
1.0000 | ORAL_TABLET | ORAL | Status: DC | PRN
  Administered 2016-09-01 – 2016-09-05 (×20): 2 via ORAL
  Filled 2016-09-01 (×20): qty 2

## 2016-09-01 MED ORDER — CEFAZOLIN SODIUM 1 G IJ SOLR
INTRAMUSCULAR | Status: AC
  Filled 2016-09-01: qty 10

## 2016-09-01 MED ORDER — AMLODIPINE BESYLATE 10 MG PO TABS
10.0000 mg | ORAL_TABLET | Freq: Every day | ORAL | Status: DC
  Administered 2016-09-02 – 2016-09-05 (×4): 10 mg via ORAL
  Filled 2016-09-01 (×4): qty 1

## 2016-09-01 MED ORDER — ZOLPIDEM TARTRATE 5 MG PO TABS
5.0000 mg | ORAL_TABLET | Freq: Every evening | ORAL | Status: DC | PRN
  Administered 2016-09-04: 5 mg via ORAL
  Filled 2016-09-01: qty 1

## 2016-09-01 MED ORDER — PROPOFOL 1000 MG/100ML IV EMUL
INTRAVENOUS | Status: AC
  Filled 2016-09-01: qty 100

## 2016-09-01 SURGICAL SUPPLY — 74 items
BIT DRILL PLIF MAS DISP 5.5MM (DRILL) ×1 IMPLANT
BLADE CLIPPER SURG (BLADE) IMPLANT
BONE CANC CHIPS 20CC PCAN1/4 (Bone Implant) ×2 IMPLANT
BONE MATRIX OSTEOCEL PRO MED (Bone Implant) ×2 IMPLANT
BUR MATCHSTICK NEURO 3.0 LAGG (BURR) ×2 IMPLANT
BUR PRECISION FLUTE 5.0 (BURR) ×2 IMPLANT
CAGE COROENT 10X9X28-8 LUMBAR (Cage) ×4 IMPLANT
CAGE COROENT LRG 10X9X28-12 (Cage) ×4 IMPLANT
CANISTER SUCT 3000ML PPV (MISCELLANEOUS) ×2 IMPLANT
CAP RELINE MOD TULIP RMM (Cap) ×4 IMPLANT
CHIPS CANC BONE 20CC PCAN1/4 (Bone Implant) ×1 IMPLANT
CLIP NEUROVISION LG (CLIP) ×2 IMPLANT
CONT SPEC 4OZ CLIKSEAL STRL BL (MISCELLANEOUS) ×2 IMPLANT
COVER BACK TABLE 60X90IN (DRAPES) ×2 IMPLANT
DECANTER SPIKE VIAL GLASS SM (MISCELLANEOUS) ×2 IMPLANT
DERMABOND ADVANCED (GAUZE/BANDAGES/DRESSINGS) ×1
DERMABOND ADVANCED .7 DNX12 (GAUZE/BANDAGES/DRESSINGS) ×1 IMPLANT
DRAPE C-ARM 42X72 X-RAY (DRAPES) ×4 IMPLANT
DRAPE C-ARMOR (DRAPES) ×2 IMPLANT
DRAPE INCISE IOBAN 66X45 STRL (DRAPES) ×2 IMPLANT
DRAPE LAPAROTOMY 100X72X124 (DRAPES) ×2 IMPLANT
DRAPE POUCH INSTRU U-SHP 10X18 (DRAPES) ×2 IMPLANT
DRAPE SURG 17X23 STRL (DRAPES) ×2 IMPLANT
DRILL PLIF MAS DISP 5.5MM (DRILL) ×2
DRSG OPSITE POSTOP 4X6 (GAUZE/BANDAGES/DRESSINGS) ×2 IMPLANT
DURAPREP 26ML APPLICATOR (WOUND CARE) ×2 IMPLANT
ELECT BLADE 4.0 EZ CLEAN MEGAD (MISCELLANEOUS) ×2
ELECT REM PT RETURN 9FT ADLT (ELECTROSURGICAL) ×2
ELECTRODE BLDE 4.0 EZ CLN MEGD (MISCELLANEOUS) ×1 IMPLANT
ELECTRODE REM PT RTRN 9FT ADLT (ELECTROSURGICAL) ×1 IMPLANT
EVACUATOR 1/8 PVC DRAIN (DRAIN) ×2 IMPLANT
GAUZE SPONGE 4X4 12PLY STRL (GAUZE/BANDAGES/DRESSINGS) ×2 IMPLANT
GAUZE SPONGE 4X4 16PLY XRAY LF (GAUZE/BANDAGES/DRESSINGS) IMPLANT
GLOVE BIO SURGEON STRL SZ8 (GLOVE) ×4 IMPLANT
GLOVE BIOGEL PI IND STRL 8 (GLOVE) ×4 IMPLANT
GLOVE BIOGEL PI IND STRL 8.5 (GLOVE) ×3 IMPLANT
GLOVE BIOGEL PI INDICATOR 8 (GLOVE) ×4
GLOVE BIOGEL PI INDICATOR 8.5 (GLOVE) ×3
GLOVE ECLIPSE 8.0 STRL XLNG CF (GLOVE) ×6 IMPLANT
GOWN STRL REUS W/ TWL LRG LVL3 (GOWN DISPOSABLE) IMPLANT
GOWN STRL REUS W/ TWL XL LVL3 (GOWN DISPOSABLE) ×2 IMPLANT
GOWN STRL REUS W/TWL 2XL LVL3 (GOWN DISPOSABLE) ×8 IMPLANT
GOWN STRL REUS W/TWL LRG LVL3 (GOWN DISPOSABLE)
GOWN STRL REUS W/TWL XL LVL3 (GOWN DISPOSABLE) ×2
KIT BASIN OR (CUSTOM PROCEDURE TRAY) ×2 IMPLANT
KIT POSITION SURG JACKSON T1 (MISCELLANEOUS) ×2 IMPLANT
KIT ROOM TURNOVER OR (KITS) ×2 IMPLANT
MODULE NVM5 NEXT GEN EMG (NEEDLE) ×2 IMPLANT
NEEDLE HYPO 25X1 1.5 SAFETY (NEEDLE) ×2 IMPLANT
NEEDLE SPNL 18GX3.5 QUINCKE PK (NEEDLE) IMPLANT
NS IRRIG 1000ML POUR BTL (IV SOLUTION) ×2 IMPLANT
PACK LAMINECTOMY NEURO (CUSTOM PROCEDURE TRAY) ×2 IMPLANT
PAD ARMBOARD 7.5X6 YLW CONV (MISCELLANEOUS) ×6 IMPLANT
PATTIES SURGICAL .5 X.5 (GAUZE/BANDAGES/DRESSINGS) IMPLANT
PATTIES SURGICAL .5 X1 (DISPOSABLE) IMPLANT
PATTIES SURGICAL 1X1 (DISPOSABLE) IMPLANT
ROD RELINE COCR LORD 5.0X65 (Rod) ×4 IMPLANT
SCREW LOCK RSS 4.5/5.0MM (Screw) ×12 IMPLANT
SCREW POLY RMM 5.5X40 4S (Screw) ×8 IMPLANT
SHANK RELINE MOD 5.5X40 (Screw) ×4 IMPLANT
SPONGE LAP 4X18 X RAY DECT (DISPOSABLE) IMPLANT
SPONGE SURGIFOAM ABS GEL 100 (HEMOSTASIS) ×2 IMPLANT
STAPLER SKIN PROX WIDE 3.9 (STAPLE) IMPLANT
SUT VIC AB 1 CT1 18XBRD ANBCTR (SUTURE) IMPLANT
SUT VIC AB 1 CT1 8-18 (SUTURE)
SUT VIC AB 2-0 CT1 18 (SUTURE) ×8 IMPLANT
SUT VIC AB 3-0 SH 8-18 (SUTURE) ×4 IMPLANT
SYR 3ML LL SCALE MARK (SYRINGE) ×4 IMPLANT
SYR 5ML LL (SYRINGE) IMPLANT
TOWEL OR 17X24 6PK STRL BLUE (TOWEL DISPOSABLE) ×2 IMPLANT
TOWEL OR 17X26 10 PK STRL BLUE (TOWEL DISPOSABLE) ×2 IMPLANT
TRAP SPECIMEN MUCOUS 40CC (MISCELLANEOUS) ×2 IMPLANT
TRAY FOLEY W/METER SILVER 16FR (SET/KITS/TRAYS/PACK) ×2 IMPLANT
WATER STERILE IRR 1000ML POUR (IV SOLUTION) ×2 IMPLANT

## 2016-09-01 NOTE — Anesthesia Preprocedure Evaluation (Addendum)
Anesthesia Evaluation  Patient identified by MRN, date of birth, ID band Patient awake    Reviewed: Allergy & Precautions, NPO status , Patient's Chart, lab work & pertinent test results  Airway Mallampati: II  TM Distance: >3 FB Neck ROM: Full    Dental  (+) Teeth Intact, Dental Advisory Given   Pulmonary asthma , Current Smoker,    breath sounds clear to auscultation       Cardiovascular hypertension,  Rhythm:Regular Rate:Normal     Neuro/Psych    GI/Hepatic   Endo/Other    Renal/GU      Musculoskeletal   Abdominal (+) + obese,   Peds  Hematology   Anesthesia Other Findings   Reproductive/Obstetrics                            Anesthesia Physical Anesthesia Plan  ASA: II  Anesthesia Plan: General   Post-op Pain Management:    Induction: Intravenous  Airway Management Planned: Oral ETT  Additional Equipment:   Intra-op Plan:   Post-operative Plan: Extubation in OR  Informed Consent: I have reviewed the patients History and Physical, chart, labs and discussed the procedure including the risks, benefits and alternatives for the proposed anesthesia with the patient or authorized representative who has indicated his/her understanding and acceptance.   Dental advisory given  Plan Discussed with: CRNA and Anesthesiologist  Anesthesia Plan Comments:         Anesthesia Quick Evaluation

## 2016-09-01 NOTE — Op Note (Signed)
09/01/2016  5:56 PM  PATIENT:  Ricardo Yoder  50 y.o. male  PRE-OPERATIVE DIAGNOSIS:  SPONDYLOLISTHESIS, LUMBAR REGION, spinal stenosis, lumbar radiculopathy, lumbago L 45 and L 34 levels  POST-OPERATIVE DIAGNOSIS:  SPONDYLOLISTHESIS, LUMBAR REGION, spinal stenosis, lumbar radiculopathy, lumbago L 45 and L 34 levels   PROCEDURE:  Procedure(s) with comments: MAXIMUM ACCESS SURGERY POSTERIOR LUMBAR INTERBODY FUSION LUMBAR THREE-FOUR, LUMBAR FOUR-FIVE (N/A) - MAXIMUM ACCESS SURGERY POSTERIOR LUMBAR INTERBODY FUSION L3-L4, L4-L5 with PEEK cages, autograft, pedicle screw fixation, posterolateral arthrodesis  Decompression greater than for standard PLIF procedure  SURGEON:  Surgeon(s) and Role:    * Maeola HarmanJoseph Lyberti Thrush, MD - Primary  PHYSICIAN ASSISTANT:   ASSISTANTS: Poteat, RN   ANESTHESIA:   general  EBL:  Total I/O In: 2750 [I.V.:2500; IV Piggyback:250] Out: 670 [Urine:220; Blood:450]  BLOOD ADMINISTERED:none  DRAINS: (Medium) Hemovact drain(s) in the epidural space with  Suction Open   LOCAL MEDICATIONS USED:  MARCAINE    and LIDOCAINE   SPECIMEN:  No Specimen  DISPOSITION OF SPECIMEN:  N/A  COUNTS:  YES  TOURNIQUET:  * No tourniquets in log *  DICTATION: DICTATION: Patient is a 50 year old with spondylolisthesis , stenosis, disc herniation and severe back and bilateral lower extremity pain at L4/5 and L 34 levels of the lumbar spine. He has an implanted pain pump for chronic pain. It was elected to take him to surgery for MASPLIF at  L 45 and L 34  levels with posterolateral arthrodesis.  Procedure:   Following uncomplicated induction of GETA, and placement of electrodes for neural monitoring, patient was turned into a prone position on the Mag[place]Jackson tableand using AP  fluoroscopy the area of planned incision was marked, prepped with betadine scrub and Duraprep, then draped. Exposure was performed of facet joint complex at L 45 and L 34 levels and the MAS retractor was  placed.5.5 x 40 mm cortical Nuvasive screws were placed at L 3 bilaterally according to standard landmarks using neural monitoring.  A total laminectomy of L 3 and L 4 was then performed with disarticulation of facets.  Decompression was greater than for standard PLIF procedure and thorough decompression of the thecal sac, bilateral L 3, L 4, L 5 nerve roots was performed. Along with foraminal and extraforaminal portions of these nerve roots.  This bone was saved for grafting, combined with Osteocel after being run through bone mill and was placed in bone packing device.  Thorough discectomy was performed bilaterally at L 45  and the endplates were prepared for grafting.  28 x 10 x 12 degree cages were placed in the interspace and positioning was confirmed with AP and lateral fluoroscopy.  10 cc of autograft/Osteocel was packed in the interspace medial to the second cage.   Thorough discectomy was performed bilaterally at L 34  and the endplates were prepared for grafting.  28 x 10 x 8 degree cages were placed in the interspace and positioning was confirmed with AP and lateral fluoroscopy.  5 cc of autograft/Osteocel was packed in the interspace medial to the second cage.   Remaining screws were placed at L4 and and L 5 and 65 mm rods were placed. And the screws were locked and torqued.Final Xrays showed well positioned implants and screw fixation. The posterolateral region on the each side was packed with remaining 15 cc of autograft on each side. A medium Hemovac drain was placed through a separate stab incision.The wounds were irrigated and then closed with 1, 2-0 and 3-0 Vicryl stitches.  20 cc long-acting Marcaine was injected into the musculature.  Sterile occlusive dressing was placed with Dermabond and an occlusive dressing. The patient was then extubated in the operating room and taken to recovery in stable and satisfactory condition having tolerated his operation well. Counts were correct at the end of the  case.  PLAN OF CARE: Admit to inpatient   PATIENT DISPOSITION:  PACU - hemodynamically stable.   Delay start of Pharmacological VTE agent (>24hrs) due to surgical blood loss or risk of bleeding: yes

## 2016-09-01 NOTE — Interval H&P Note (Signed)
History and Physical Interval Note:  09/01/2016 1:25 PM  Ricardo Yoder  has presented today for surgery, with the diagnosis of SPONDYLOLISTHESIS, LUMBAR REGION  The various methods of treatment have been discussed with the patient and family. After consideration of risks, benefits and other options for treatment, the patient has consented to  Procedure(s) with comments: MAXIMUM ACCESS SURGERY POSTERIOR LUMBAR INTERBODY FUSION L3-L4, L4-L5 (N/A) - MAXIMUM ACCESS SURGERY POSTERIOR LUMBAR INTERBODY FUSION L3-L4, L4-L5 as a surgical intervention .  The patient's history has been reviewed, patient examined, no change in status, stable for surgery.  I have reviewed the patient's chart and labs.  Questions were answered to the patient's satisfaction.     [name]

## 2016-09-01 NOTE — Anesthesia Procedure Notes (Signed)
Procedure Name: Intubation Date/Time: 09/01/2016 1:43 PM Performed by: Rise PatienceBELL, Mekhia Brogan T Pre-anesthesia Checklist: Patient identified, Emergency Drugs available, Suction available and Patient being monitored Patient Re-evaluated:Patient Re-evaluated prior to inductionOxygen Delivery Method: Circle System Utilized Preoxygenation: Pre-oxygenation with 100% oxygen Intubation Type: IV induction and Rapid sequence Laryngoscope Size: Miller and 2 Grade View: Grade I Tube type: Oral Tube size: 7.5 mm Number of attempts: 1 Airway Equipment and Method: Stylet and Oral airway Placement Confirmation: ETT inserted through vocal cords under direct vision,  positive ETCO2 and breath sounds checked- equal and bilateral Secured at: 22 cm Tube secured with: Tape Dental Injury: Teeth and Oropharynx as per pre-operative assessment

## 2016-09-01 NOTE — Progress Notes (Signed)
Subjective: Patient reports sore in back  Objective: Vital signs in last 24 hours: Temp:  [98.1 F (36.7 C)-98.4 F (36.9 C)] 98.1 F (36.7 C) (09/08 1715) Pulse Rate:  [77-90] 90 (09/08 1800) Resp:  [14-20] 18 (09/08 1800) BP: (100-160)/(70-91) 160/90 (09/08 1745) SpO2:  [95 %-100 %] 100 % (09/08 1800) Weight:  [118.4 kg (261 lb)] 118.4 kg (261 lb) (09/08 1123)  Intake/Output from previous day: No intake/output data recorded. Intake/Output this shift: Total I/O In: 2750 [I.V.:2500; IV Piggyback:250] Out: 670 [Urine:220; Blood:450]  Physical Exam: Sore in back, leg strength full bilaterally.    Lab Results:  Recent Labs  08/30/16 0956  WBC 11.2*  HGB 16.3  HCT 48.1  PLT 231   BMET  Recent Labs  08/30/16 0956  NA 139  K 3.9  CL 108  CO2 25  GLUCOSE 103*  BUN 13  CREATININE 1.28*  CALCIUM 9.6    Studies/Results: Dg Lumbar Spine 2-3 Views  Result Date: 09/01/2016 CLINICAL DATA:  Lumbar spine fusion EXAM: DG C-ARM 61-120 MIN; LUMBAR SPINE - 2-3 VIEW COMPARISON:  02/23/2016 FINDINGS: Portable intraoperative radiographs of the lumbar spine show pedicle screw placement at L3-L4 and L5. Previous anterior lumbar disc fusion at L5-S1 noted. IMPRESSION: 1. Portable intraoperative radiographs show bilateral pedicle screw placement at L3 through L5. Electronically Signed   By: Signa Kellaylor  Stroud M.D.   On: 09/01/2016 17:03   Dg C-arm 1-60 Min  Result Date: 09/01/2016 CLINICAL DATA:  Lumbar spine fusion EXAM: DG C-ARM 61-120 MIN; LUMBAR SPINE - 2-3 VIEW COMPARISON:  02/23/2016 FINDINGS: Portable intraoperative radiographs of the lumbar spine show pedicle screw placement at L3-L4 and L5. Previous anterior lumbar disc fusion at L5-S1 noted. IMPRESSION: 1. Portable intraoperative radiographs show bilateral pedicle screw placement at L3 through L5. Electronically Signed   By: Signa Kellaylor  Stroud M.D.   On: 09/01/2016 17:03    Assessment/Plan: Doing well immediately postop PLIF.    LOS: 0 days    Dorian HeckleSTERN,Liana Camerer D, MD 09/01/2016, 6:11 PM

## 2016-09-01 NOTE — Progress Notes (Signed)
Patient arrived from PACU alert and oriented. Safety precautions and orders reviewed with patient. SCDs on.  ICS at bedside and encourage. C/o minimal pain at this time. Will continue to monitor.   Sim BoastHavy, RN

## 2016-09-01 NOTE — Brief Op Note (Signed)
09/01/2016  5:56 PM  PATIENT:  Ricardo Yoder  50 y.o. male  PRE-OPERATIVE DIAGNOSIS:  SPONDYLOLISTHESIS, LUMBAR REGION, spinal stenosis, lumbar radiculopathy, lumbago L 45 and L 34 levels  POST-OPERATIVE DIAGNOSIS:  SPONDYLOLISTHESIS, LUMBAR REGION, spinal stenosis, lumbar radiculopathy, lumbago L 45 and L 34 levels   PROCEDURE:  Procedure(s) with comments: MAXIMUM ACCESS SURGERY POSTERIOR LUMBAR INTERBODY FUSION LUMBAR THREE-FOUR, LUMBAR FOUR-FIVE (N/A) - MAXIMUM ACCESS SURGERY POSTERIOR LUMBAR INTERBODY FUSION L3-L4, L4-L5 with PEEK cages, autograft, pedicle screw fixation, posterolateral arthrodesis  Decompression greater than for standard PLIF procedure  SURGEON:  Surgeon(s) and Role:    * Maeola HarmanJoseph Ronda Rajkumar, MD - Primary  PHYSICIAN ASSISTANT:   ASSISTANTS: Poteat, RN   ANESTHESIA:   general  EBL:  Total I/O In: 2750 [I.V.:2500; IV Piggyback:250] Out: 670 [Urine:220; Blood:450]  BLOOD ADMINISTERED:none  DRAINS: (Medium) Hemovact drain(s) in the epidural space with  Suction Open   LOCAL MEDICATIONS USED:  MARCAINE    and LIDOCAINE   SPECIMEN:  No Specimen  DISPOSITION OF SPECIMEN:  N/A  COUNTS:  YES  TOURNIQUET:  * No tourniquets in log *  DICTATION: DICTATION: Patient is a 50 year old with spondylolisthesis , stenosis, disc herniation and severe back and bilateral lower extremity pain at L4/5 and L 34 levels of the lumbar spine. He has an implanted pain pump for chronic pain. It was elected to take him to surgery for MASPLIF at  L 45 and L 34  levels with posterolateral arthrodesis.  Procedure:   Following uncomplicated induction of GETA, and placement of electrodes for neural monitoring, patient was turned into a prone position on the Mag[place]Jackson tableand using AP  fluoroscopy the area of planned incision was marked, prepped with betadine scrub and Duraprep, then draped. Exposure was performed of facet joint complex at L 45 and L 34 levels and the MAS retractor was  placed.5.5 x 40 mm cortical Nuvasive screws were placed at L 3 bilaterally according to standard landmarks using neural monitoring.  A total laminectomy of L 3 and L 4 was then performed with disarticulation of facets.  Decompression was greater than for standard PLIF procedure and thorough decompression of the thecal sac, bilateral L 3, L 4, L 5 nerve roots was performed. Along with foraminal and extraforaminal portions of these nerve roots.  This bone was saved for grafting, combined with Osteocel after being run through bone mill and was placed in bone packing device.  Thorough discectomy was performed bilaterally at L 45  and the endplates were prepared for grafting.  28 x 10 x 12 degree cages were placed in the interspace and positioning was confirmed with AP and lateral fluoroscopy.  10 cc of autograft/Osteocel was packed in the interspace medial to the second cage.   Thorough discectomy was performed bilaterally at L 34  and the endplates were prepared for grafting.  28 x 10 x 8 degree cages were placed in the interspace and positioning was confirmed with AP and lateral fluoroscopy.  5 cc of autograft/Osteocel was packed in the interspace medial to the second cage.   Remaining screws were placed at L4 and and L 5 and 65 mm rods were placed. And the screws were locked and torqued.Final Xrays showed well positioned implants and screw fixation. The posterolateral region on the each side was packed with remaining 15 cc of autograft on each side. A medium Hemovac drain was placed through a separate stab incision.The wounds were irrigated and then closed with 1, 2-0 and 3-0 Vicryl stitches.  20 cc long-acting Marcaine was injected into the musculature.  Sterile occlusive dressing was placed with Dermabond and an occlusive dressing. The patient was then extubated in the operating room and taken to recovery in stable and satisfactory condition having tolerated his operation well. Counts were correct at the end of the  case.  PLAN OF CARE: Admit to inpatient   PATIENT DISPOSITION:  PACU - hemodynamically stable.   Delay start of Pharmacological VTE agent (>24hrs) due to surgical blood loss or risk of bleeding: yes

## 2016-09-01 NOTE — Transfer of Care (Signed)
Immediate Anesthesia Transfer of Care Note  Patient: Ricardo Yoder  Procedure(s) Performed: Procedure(s) with comments: MAXIMUM ACCESS SURGERY POSTERIOR LUMBAR INTERBODY FUSION LUMBAR THREE-FOUR, LUMBAR FOUR-FIVE (N/A) - MAXIMUM ACCESS SURGERY POSTERIOR LUMBAR INTERBODY FUSION L3-L4, L4-L5  Patient Location: PACU  Anesthesia Type:General  Level of Consciousness: awake, alert  and oriented  Airway & Oxygen Therapy: Patient Spontanous Breathing  Post-op Assessment: Report given to RN and Post -op Vital signs reviewed and stable  Post vital signs: Reviewed and stable  Last Vitals:  Vitals:   09/01/16 1123  BP: (!) 130/91  Pulse: 77  Resp: 20  Temp: 36.9 C    Last Pain:  Vitals:   09/01/16 1143  TempSrc:   PainSc: 9       Patients Stated Pain Goal: 5 (09/01/16 1143)  Complications: No apparent anesthesia complications

## 2016-09-01 NOTE — Anesthesia Postprocedure Evaluation (Signed)
Anesthesia Post Note  Patient: Ricardo Yoder  Procedure(s) Performed: Procedure(s) (LRB): MAXIMUM ACCESS SURGERY POSTERIOR LUMBAR INTERBODY FUSION LUMBAR THREE-FOUR, LUMBAR FOUR-FIVE (N/A)  Patient location during evaluation: PACU Anesthesia Type: General Level of consciousness: awake, awake and alert and oriented Pain management: pain level controlled Vital Signs Assessment: post-procedure vital signs reviewed and stable Respiratory status: spontaneous breathing, nonlabored ventilation and respiratory function stable Cardiovascular status: blood pressure returned to baseline Anesthetic complications: no    Last Vitals:  Vitals:   09/01/16 1745 09/01/16 1800  BP: (!) 160/90   Pulse: 88 90  Resp: 14 18  Temp:      Last Pain:  Vitals:   09/01/16 1800  TempSrc:   PainSc: 10-Worst pain ever                 Dafne Nield COKER

## 2016-09-02 MED ORDER — FAMOTIDINE 20 MG PO TABS
20.0000 mg | ORAL_TABLET | Freq: Two times a day (BID) | ORAL | Status: DC
  Administered 2016-09-02 – 2016-09-05 (×6): 20 mg via ORAL
  Filled 2016-09-02 (×5): qty 1

## 2016-09-02 NOTE — Progress Notes (Signed)
Postop day 1. Patient with quite a bit of back pain. No radicular pain. No new symptoms of numbness or weakness. Patient uncomfortable and rested very little last night.  Afebrile. Vitals are stable. Awake and alert. Oriented and appropriate. Drain output moderately high. Motor and sensory examination intact. Abdomen soft.  Progressing fairly well following 2 level lumbar decompression and fusion. Continue efforts at mobilization and pain control.

## 2016-09-02 NOTE — Evaluation (Signed)
Occupational Therapy Evaluation and Discharge Patient Details Name: Ricardo DissRobert Corpus MRN: [id number] DOB: 05/26/1966 Today's Date: 09/02/2016    History of Present Illness 50 yo male, with lumbar spondylolisthesis and radiculopathy, post L3-5 decompression and fusion on 09/01/16.  History of multiple spinal surgeries.   Clinical Impression   Pt reports he was independent with ADL PTA. Currently pt overall supervision for safety with ADL and functional mobility. Pt reports he has AE at home which he is familiar with using from previous back sx. All back, safety, and ADL education completed with pt and wife. Pt needs 3 in 1 for home use. Pt planning to d/c home with 24/7 supervision from his wife. No further acute OT needs identified; signing off at this time. Please re-consult if needs change. Thank you for this referral.    Follow Up Recommendations  No OT follow up;Supervision/Assistance - 24 hour (initially)    Equipment Recommendations  3 in 1 bedside comode    Recommendations for Other Services       Precautions / Restrictions Precautions Precautions: Back Precaution Booklet Issued: No (Already provided by PT) Precaution Comments: Reviewed back precautions with pt. He was unable to recall at start of session. Required Braces or Orthoses: Spinal Brace Spinal Brace: Lumbar corset;Applied in sitting position Restrictions Weight Bearing Restrictions: No      Mobility Bed Mobility Overal bed mobility: Needs Assistance Bed Mobility: Rolling;Sidelying to Sit Rolling: Supervision Sidelying to sit: Min guard       General bed mobility comments: Increased time required. HOB flat with use of bed rail. Good technique.  Transfers Overall transfer level: Needs assistance Equipment used: Rolling walker (2 wheeled) Transfers: Sit to/from Stand Sit to Stand: Supervision         General transfer comment: Supervision for safety. Good hand placement, cues for upright posture.     Balance Overall balance assessment: Needs assistance Sitting-balance support: Feet supported;Bilateral upper extremity supported Sitting balance-Leahy Scale: Fair     Standing balance support: Bilateral upper extremity supported;During functional activity Standing balance-Leahy Scale: Poor                              ADL Overall ADL's : Needs assistance/impaired Eating/Feeding: Set up;Sitting   Grooming: Supervision/safety;Standing Grooming Details (indicate cue type and reason): Educated pt on use of 2 cups for oral care Upper Body Bathing: Set up;Supervision/ safety;Sitting   Lower Body Bathing: Set up;Supervison/ safety;With adaptive equipment;Sit to/from stand   Upper Body Dressing : Set up;Supervision/safety;Sitting Upper Body Dressing Details (indicate cue type and reason): to don back brace Lower Body Dressing: Set up;Supervision/safety;With adaptive equipment;Sit to/from stand   Toilet Transfer: Set up;Supervision/safety;Ambulation;BSC;RW Toilet Transfer Details (indicate cue type and reason): Simulated by sit to stand from EOB with functional mobility. Toileting- Clothing Manipulation and Hygiene: Supervision/safety;Sit to/from stand Toileting - Clothing Manipulation Details (indicate cue type and reason): Educated on proper technique for peri care to avoid twisting. Tub/ Shower Transfer: Supervision/safety;Tub transfer;Ambulation;Tub bench;Rolling walker   Functional mobility during ADLs: Supervision/safety;Rolling walker General ADL Comments: Pt reports he has AE at home and is familiar with use; no questions or concerns. This is pts 5th back sx; recalls a lot of educational information from prior sx. Reviewed maintaining back precautions during functional activities and log roll technique.     Vision Vision Assessment?: No apparent visual deficits   Perception     Praxis      Pertinent Vitals/Pain Pain Assessment: 0-10  Pain Score: 9  Pain  Location: back Pain Descriptors / Indicators: Aching;Guarding;Grimacing Pain Intervention(s): Monitored during session;Repositioned;Patient requesting pain meds-RN notified     Hand Dominance     Extremity/Trunk Assessment Upper Extremity Assessment Upper Extremity Assessment: Overall WFL for tasks assessed   Lower Extremity Assessment Lower Extremity Assessment: Defer to PT evaluation   Cervical / Trunk Assessment Cervical / Trunk Assessment: Other exceptions Cervical / Trunk Exceptions: s/p spinal sx   Communication Communication Communication: No difficulties   Cognition Arousal/Alertness: Awake/alert Behavior During Therapy: WFL for tasks assessed/performed Overall Cognitive Status: Within Functional Limits for tasks assessed                     General Comments       Exercises       Shoulder Instructions      Home Living Family/patient expects to be discharged to:: Private residence Living Arrangements: Spouse/significant other Available Help at Discharge: Family;Available 24 hours/day Type of Home: House Home Access: Stairs to enter [employer] of Steps: 3   Home Layout: One level     Bathroom Shower/Tub: Tub/shower unit Shower/tub characteristics: Engineer, building services: Standard     Home Equipment: Walker - standard;Cane - single point;Tub bench;Hand held shower head;Adaptive equipment Adaptive Equipment: Reacher;Sock aid;Long-handled shoe horn;Long-handled sponge Additional Comments: Need bedside commode.      Prior Functioning/Environment Level of Independence: Independent with assistive device(s)        Comments: Has been using walker due to pain    OT Diagnosis: Generalized weakness;Acute pain   OT Problem List:     OT Treatment/Interventions:      OT Goals(Current goals can be found in the care plan section) Acute Rehab OT Goals Patient Stated Goal: Stop pain OT Goal Formulation: All assessment and education  complete, DC therapy  OT Frequency:     Barriers to D/C:            Co-evaluation              End of Session Equipment Utilized During Treatment: Rolling walker;Back brace Nurse Communication: Mobility status;Patient requests pain meds  Activity Tolerance: Patient tolerated treatment well Patient left: in chair;with call bell/phone within reach;with family/visitor present   Time: 1610-9604 OT Time Calculation (min): 23 min Charges:  OT General Charges $OT Visit: 1 Procedure OT Evaluation $OT Eval Moderate Complexity: 1 Procedure OT Treatments $Self Care/Home Management : 8-22 mins G-Codes:     Gaye Alken M.S., OTR/L Pager: [phone]  09/02/2016, 3:55 PM

## 2016-09-02 NOTE — Progress Notes (Signed)
Physical Therapy Evaluation Patient Details Name: Ricardo Yoder MRN: [id number] DOB: 03/03/1966 Today's Date: 09/02/2016   History of Present Illness  50 yo male, with lumbar spondylolisthesis and radiculopathy, post L3-5 decompression and fusion on 09/01/16.  History of multiple spinal surgeries.  Clinical Impression  Patient still has surgical drain in place, reports symptoms in legs are reduced, but not eliminated at this time.  MIN assist for bed mobility and transfers, Guard assist for ambulation using walker.  Started out slow and painful, movements became smoother and more normalized as treatment progressed.  Patient will benefit from skilled PT services to progress mobility, anticipate eventual ability to return home with family to assist as pain decreases.    Follow Up Recommendations Supervision/Assistance - 24 hour    Equipment Recommendations  3in1 (PT)    Recommendations for Other Services       Precautions / Restrictions Precautions Precautions: Back Precaution Booklet Issued: Yes (comment) Required Braces or Orthoses: Spinal Brace Spinal Brace: Lumbar corset;Applied in sitting position Restrictions Weight Bearing Restrictions: No      Mobility  Bed Mobility Overal bed mobility: Needs Assistance Bed Mobility: Rolling;Sidelying to Sit Rolling: Supervision Sidelying to sit: Min assist       General bed mobility comments: Painful, good technique, likely needs increased help to lie down again.  Transfers Overall transfer level: Needs assistance Equipment used: Rolling walker (2 wheeled) Transfers: Sit to/from Stand Sit to Stand: Min guard         General transfer comment: Cues for hand position and technique for sit <> stand  Ambulation/Gait Ambulation/Gait assistance: Min guard Ambulation Distance (Feet): 200 Feet Assistive device: Rolling walker (2 wheeled) Gait Pattern/deviations: Step-through pattern;Decreased stride length;Antalgic   Gait  velocity interpretation: Below normal speed for age/gender General Gait Details: Started slow and painful, better with practice.  Stairs            Wheelchair Mobility    Modified Rankin (Stroke Patients Only)       Balance Overall balance assessment: Needs assistance   Sitting balance-Leahy Scale: Fair     Standing balance support: Bilateral upper extremity supported Standing balance-Leahy Scale: Poor                               Pertinent Vitals/Pain Pain Assessment: 0-10 Pain Score: 8  Pain Location: Low back and hips Pain Descriptors / Indicators: Aching Pain Intervention(s): Limited activity within patient's tolerance;Monitored during session;Premedicated before session    Home Living Family/patient expects to be discharged to:: Private residence Living Arrangements: Spouse/significant other   Type of Home: House Home Access: Stairs to enter   Secretary/administratorntrance Stairs-Number of Steps: 3   Home Equipment: Walker - standard Additional Comments: Need bedside commode.    Prior Function Level of Independence: Independent with assistive device(s)         Comments: Has been using walker due to pain     Hand Dominance        Extremity/Trunk Assessment   Upper Extremity Assessment: Defer to OT evaluation;Overall [facility] tasks assessed           Lower Extremity Assessment: Generalized weakness;Overall WFL for tasks assessed (Reports L LE numb, still present but better than before surg)         Communication   Communication: No difficulties  Cognition Arousal/Alertness: Awake/alert Behavior During Therapy: WFL for tasks assessed/performed Overall Cognitive Status: Within Functional Limits for tasks assessed  General Comments General comments (skin integrity, edema, etc.): surgical drain still in place    Exercises        Assessment/Plan    PT Assessment Patient needs continued PT services  PT  Diagnosis Difficulty walking;Acute pain;Generalized weakness   PT Problem List Decreased strength;Decreased activity tolerance;Decreased mobility;Pain  PT Treatment Interventions Gait training;Functional mobility training;Therapeutic activities;Therapeutic exercise;Patient/family education   PT Goals (Current goals can be found in the Care Plan section) Acute Rehab PT Goals Patient Stated Goal: Stop pain    Frequency Min 5X/week   Barriers to discharge        Co-evaluation               End of Session Equipment Utilized During Treatment: Gait belt;Back brace Activity Tolerance: Patient tolerated treatment well Patient left: in chair;with call bell/phone within reach;with family/visitor present Nurse Communication: Mobility status         Time: 1110-1145 PT Time Calculation (min) (ACUTE ONLY): 35 min   Charges:   PT Evaluation $PT Eval Low Complexity: 1 Procedure PT Treatments $Gait Training: 8-22 mins   PT G CodesFreida Busman, Shaquelle Hernon L 2016/09/25, 12:22 PM

## 2016-09-03 NOTE — Progress Notes (Signed)
Patient ID: Ricardo DissRobert Bialas, male   DOB: 07/21/1966, 50 y.o.   MRN: [id number] Patient with condition of back pain says legs feel better  Strength out of 5 wound clean dry and intact  Pain still poorly controlled patient will need additional time to get under control before discharge.

## 2016-09-03 NOTE — Progress Notes (Signed)
Physical Therapy Treatment Patient Details Name: Ricardo Yoder MRN: [id number] DOB: 11/07/1966 Today's Date: 09/03/2016    History of Present Illness 50 yo male, with lumbar spondylolisthesis and radiculopathy, post L3-5 decompression and fusion on 09/01/16.  History of multiple spinal surgeries.    PT Comments    Continuing progress with amb and activity tolerance; Pain is still a factor, but pt is able to get up and walk despite 8/10 pain; on track for dc tomorrow  Follow Up Recommendations  Supervision/Assistance - 24 hour     Equipment Recommendations  3in1 (PT)    Recommendations for Other Services       Precautions / Restrictions Precautions Precautions: Back Precaution Comments: Reviewed back precautions  Required Braces or Orthoses: Spinal Brace Spinal Brace: Lumbar corset;Applied in sitting position    Mobility  Bed Mobility Overal bed mobility: Needs Assistance Bed Mobility: Rolling;Sidelying to Sit Rolling: Supervision Sidelying to sit: Min assist       General bed mobility comments: Increased time required. HOB flat with use of bed rail. Good technique.  Transfers Overall transfer level: Needs assistance Equipment used: Rolling walker (2 wheeled) Transfers: Sit to/from Stand Sit to Stand: Supervision         General transfer comment: Supervision for safety. Good hand placement, cues for upright posture.  Ambulation/Gait Ambulation/Gait assistance: Min guard Ambulation Distance (Feet): 180 Feet Assistive device: Rolling walker (2 wheeled) Gait Pattern/deviations: Step-through pattern;Decreased step length - right;Decreased step length - left   Gait velocity interpretation: Below normal speed for age/gender General Gait Details: Occasional need for cues for fully upright posture; Heavy relaince on UE support on RW   Stairs Stairs: Yes Stairs assistance: Min assist Stair Management: One rail Right;Step to pattern;Forwards Number of Stairs:  5 General stair comments: Cues for sequencing and offeredsuggestions for how his wife can assist with support; tells me he plans to hold the door on the right going in  Wheelchair Mobility    Modified Rankin (Stroke Patients Only)       Balance     Sitting balance-Leahy Scale: Fair       Standing balance-Leahy Scale: Poor (approaching Fair)                      Cognition Arousal/Alertness: Awake/alert Behavior During Therapy: WFL for tasks assessed/performed Overall Cognitive Status: Within Functional Limits for tasks assessed                      Exercises      General Comments        Pertinent Vitals/Pain Pain Assessment: 0-10 Pain Score: 8  Pain Location: back pain Pain Descriptors / Indicators: Operative site guarding;Grimacing;Aching Pain Intervention(s): Limited activity within patient's tolerance;Monitored during session    Home Living                      Prior Function            PT Goals (current goals can now be found in the care plan section) Acute Rehab PT Goals Patient Stated Goal: Stop pain Progress towards PT goals: Progressing toward goals    Frequency  Min 5X/week    PT Plan Current plan remains appropriate    Co-evaluation             End of Session Equipment Utilized During Treatment: Back brace Activity Tolerance: Patient tolerated treatment well Patient left: in chair;with call bell/phone within reach;with family/visitor present  Time: 0981-1914 PT Time Calculation (min) (ACUTE ONLY): 18 min  Charges:  $Gait Training: 8-22 mins                    G Codes:      Ricardo Yoder 09/03/2016, 3:58 PM  Ricardo Yoder, Ricardo Yoder  Acute Rehabilitation Services Pager [phone] Office [phone]

## 2016-09-04 MED FILL — Heparin Sodium (Porcine) Inj 1000 Unit/ML: INTRAMUSCULAR | Qty: 30 | Status: AC

## 2016-09-04 MED FILL — Sodium Chloride IV Soln 0.9%: INTRAVENOUS | Qty: 1000 | Status: AC

## 2016-09-04 NOTE — Progress Notes (Signed)
Physical Therapy Treatment Patient Details Name: Ricardo DissRobert Marschall MRN: [id number] DOB: 06/23/1966 Today's Date: 09/04/2016    History of Present Illness 50 yo male, with lumbar spondylolisthesis and radiculopathy, post L3-5 decompression and fusion on 09/01/16.  History of multiple spinal surgeries.    PT Comments    Patient continues to do well with PT. Reviewed precautions. Supervision overall for mobility. Current plan remains appropriate.   Follow Up Recommendations  Supervision/Assistance - 24 hour     Equipment Recommendations  3in1 (PT)    Recommendations for Other Services       Precautions / Restrictions Precautions Precautions: Back Precaution Comments: Reviewed back precautions  Required Braces or Orthoses: Spinal Brace Spinal Brace: Lumbar corset;Applied in sitting position Restrictions Weight Bearing Restrictions: No    Mobility  Bed Mobility Overal bed mobility: Needs Assistance Bed Mobility: Rolling;Sidelying to Sit;Sit to Sidelying Rolling: Supervision Sidelying to sit: Supervision     Sit to sidelying: Supervision General bed mobility comments: increased time and use of rail to elevate trunk and roll; cues for technique and supervision for safety  Transfers Overall transfer level: Needs assistance Equipment used: Rolling walker (2 wheeled) Transfers: Sit to/from Stand Sit to Stand: Supervision         General transfer comment: safe hand placement and good technique  Ambulation/Gait Ambulation/Gait assistance: Supervision Ambulation Distance (Feet): 200 Feet Assistive device: Rolling walker (2 wheeled) Gait Pattern/deviations: Step-through pattern;Decreased stride length;Trunk flexed     General Gait Details: cues for posture; heavy reilance on RW   Stairs Stairs: Yes Stairs assistance: Min assist Stair Management: No rails;Forwards;Sideways Number of Stairs: 2 General stair comments: HHA for balance; cues for sequencing and technique;  ascended forward and descended sideways; no rails to simulate home  Wheelchair Mobility    Modified Rankin (Stroke Patients Only)       Balance   Sitting-balance support: Feet supported;Single extremity supported Sitting balance-Leahy Scale: Good       Standing balance-Leahy Scale: Fair (static standing)                      Cognition Arousal/Alertness: Awake/alert Behavior During Therapy: WFL for tasks assessed/performed Overall Cognitive Status: Within Functional Limits for tasks assessed                      Exercises      General Comments        Pertinent Vitals/Pain Pain Assessment: 0-10 Pain Score: 9  Pain Location: back Pain Descriptors / Indicators: Sore Pain Intervention(s): Limited activity within patient's tolerance;Monitored during session;Repositioned    Home Living                      Prior Function            PT Goals (current goals can now be found in the care plan section) Acute Rehab PT Goals Patient Stated Goal: decrease pain and get better Progress towards PT goals: Progressing toward goals    Frequency  Min 5X/week    PT Plan Current plan remains appropriate    Co-evaluation             End of Session Equipment Utilized During Treatment: Back brace Activity Tolerance: Patient tolerated treatment well Patient left: with call bell/phone within reach;with family/visitor present;in bed     Time: [id number] PT Time Calculation (min) (ACUTE ONLY): 31 min  Charges:  $Gait Training: 8-22 mins $Therapeutic Activity: 8-22 mins  G Codes:      Derek Mound, PTA Pager: [phone]   09/04/2016, 9:40 AM

## 2016-09-04 NOTE — Care Management Important Message (Signed)
Important Message  Patient Details  Name: Gerda DissRobert Symonds MRN: [id number] Date of Birth: 12/13/1966   Medicare Important Message Given:  Yes    Kennth Vanbenschoten Stefan ChurchBratton 09/04/2016, 2:58 PM

## 2016-09-04 NOTE — Progress Notes (Signed)
Hemovac stitches out a little when pt used restroom.  Reapplied suctioned and blood was still draining.  A little bit of blood around hemovac site. Hemovac inflated at a different time and reapplied suctioned but it inflated.

## 2016-09-04 NOTE — Clinical Social Work Note (Signed)
CSW consulted for New SNF. PT is recommending 24 hour supervision/assistance. CSW discussed pt with RNCM, who will follow for discharge planning needs. CSW is signing off as no further needs identified.   Dede QuerySarah Giamarie Bueche, MSW, LCSW  Clinical Social Worker  [phone]

## 2016-09-04 NOTE — Progress Notes (Addendum)
Subjective: Patient reports "I hurt, but it seems beter when I walk"  Objective: Vital signs in last 24 hours: Temp:  [98.2 F (36.8 C)-99.1 F (37.3 C)] 98.3 F (36.8 C) (09/11 0447) Pulse Rate:  [68-84] 68 (09/11 0447) Resp:  [18-20] 18 (09/11 0447) BP: (107-126)/(59-80) 107/66 (09/11 0447) SpO2:  [94 %-96 %] 95 % (09/11 0447)  Intake/Output from previous day: 09/10 0701 - 09/11 0700 In: -  Out: 120 [Drains:120] Intake/Output this shift: No intake/output data recorded.  Alert, getting back into bed. Incision without erythema, swelling or drainage beneath honeycomb & Dermabond, Drain patent, decreased overnight. Good strength BLE. No BM yet, but nondistended & passing gas.   Lab Results: No results for input(s): WBC, HGB, HCT, PLT in the last 72 hours. BMET No results for input(s): NA, K, CL, CO2, GLUCOSE, BUN, CREATININE, CALCIUM in the last 72 hours.  Studies/Results: No results found.  Assessment/Plan: Improving  LOS: 3 days  Pt tolerated d/c of hemovac without complaint, DSD to site. Mobilize today. Continue Percocet, encourage muscle relaxers as well to prevent needing stronger pain med. Home later today or tomorrow if pain remains controlled.   Georgiann Cockeroteat, Brian 09/04/2016, 9:08 AM   Mobilize in brace with PT.

## 2016-09-05 MED ORDER — METHOCARBAMOL 500 MG PO TABS: 500.0000 mg | ORAL_TABLET | Freq: Four times a day (QID) | ORAL | 1 refills | Status: DC | PRN

## 2016-09-05 MED ORDER — OXYCODONE-ACETAMINOPHEN 5-325 MG PO TABS: 1.0000 | ORAL_TABLET | ORAL | 0 refills | Status: DC | PRN

## 2016-09-05 NOTE — Discharge Summary (Addendum)
Physician Discharge Summary  Patient ID: Ricardo Yoder Renaud MRN: [id number] DOB/AGE: 51/06/1966 51 y.o.  Admit date: 09/01/2016 Discharge date: 09/05/2016  Admission Diagnoses: SPONDYLOLISTHESIS, LUMBAR REGION, spinal stenosis, lumbar radiculopathy, lumbago L 45 and L 34 levels   Discharge Diagnoses: SPONDYLOLISTHESIS, LUMBAR REGION, spinal stenosis, lumbar radiculopathy, lumbago L 45 and L 34 levels s/p MAXIMUM ACCESS SURGERY POSTERIOR LUMBAR INTERBODY FUSION LUMBAR THREE-FOUR, LUMBAR FOUR-FIVE (N/A) - MAXIMUM ACCESS SURGERY POSTERIOR LUMBAR INTERBODY FUSION L3-L4, L4-L5 with PEEK cages, autograft, pedicle screw fixation, posterolateral arthrodesis  Active Problems:   Spondylolisthesis of lumbar region   Discharged Condition: good  Hospital Course: Ricardo Yoder Barton was admitted for surgery with dx spondylolisthesis and radiculopathy. Following uncomplicated decompression and fusion, he recovered well and transferred to [facility] for nursing care and therapies. He has mobilized well.  Consults: None  Significant Diagnostic Studies: radiology: X-Ray: intra-op  Treatments: surgery: MAXIMUM ACCESS SURGERY POSTERIOR LUMBAR INTERBODY FUSION LUMBAR THREE-FOUR, LUMBAR FOUR-FIVE (N/A) - MAXIMUM ACCESS SURGERY POSTERIOR LUMBAR INTERBODY FUSION L3-L4, L4-L5 with PEEK cages, autograft, pedicle screw fixation, posterolateral arthrodesis   Discharge Exam: Blood pressure 116/67, pulse 77, temperature 99.4 F (37.4 C), temperature source Oral, resp. rate 16, height 6\' 1"  (1.854 m), weight 119.9 kg (264 lb 4.8 oz), SpO2 96 %. Ambulating in room in LSO. Reporting good night's sleep and improved pain control with Robaxin last night. Incision without erythema, swelling, or drainage beneath honeycomb & Dermabond.  Good strength BLE.    Disposition: Discharge to home. Rx's Percocet 10/325 1 po q4-6hrs prn pain #60; Robaxin 500mg  po q6hrs prn spasm. Pt verbalizes understanding of d/c instructions. Office f/u in 3-4  weeks.  3-in 1 elevated commode seat for home use.        Medication List    TAKE these medications   amLODipine 10 MG tablet Commonly known as:  NORVASC Take 10 mg by mouth daily.   methocarbamol 500 MG tablet Commonly known as:  ROBAXIN Take 1 tablet (500 mg total) by mouth every 6 (six) hours as needed for muscle spasms.   omeprazole 40 MG capsule Commonly known as:  PRILOSEC Take 40 mg by mouth daily.   oxyCODONE-acetaminophen 10-325 MG tablet Commonly known as:  PERCOCET Take 0.5-2 tablets by mouth every 6 (six) hours as needed for pain. What changed:  Another medication with the same name was added. Make sure you understand how and when to take each.   oxyCODONE-acetaminophen 5-325 MG tablet Commonly known as:  PERCOCET/ROXICET Take 1-2 tablets by mouth every 4 (four) hours as needed for moderate pain. What changed:  You were already taking a medication with the same name, and this prescription was added. Make sure you understand how and when to take each.        Signed: Georgiann Cockeroteat, Brian 09/05/2016, 7:48 AM  Patient doing well.  Discharge home today.

## 2016-09-05 NOTE — Care Management Note (Signed)
Case Management Note  Patient Details  Name: Gerda DissRobert Brase MRN: [id number] Date of Birth: 11/22/1966  Subjective/Objective:                    Action/Plan: Pt discharging home with orders for elevated commode and 3 in 1. Pt does not want elevated commode seat but he does want the 3 in 1. [facility] notified and will deliver the equipment to the room. Bedside RN updated.  Expected Discharge Date:  09/04/16               Expected Discharge Plan:  Home/Self Care  In-House Referral:     Discharge planning Services  CM Consult  Post Acute Care Choice:  Durable Medical Equipment Choice offered to:  Patient  DME Arranged:  3-N-1, Eelevated commode seat DME Agency:  [facility]  [facility]     Status of Service:  Completed, signed off  If discussed at Long Length of Stay Meetings, dates discussed:    Additional Comments:  Kermit BaloKelli F Khole Arterburn, RN 09/05/2016, 11:49 AM

## 2016-09-05 NOTE — Discharge Instructions (Signed)
Spinal Fusion, Care After °Refer to this sheet in the next few weeks. These instructions provide you with information on caring for yourself after your procedure. Your caregiver may also give you more specific instructions. Your treatment has been planned according to current medical practices, but problems sometimes occur. Call your caregiver if you have any problems or questions after your procedure. °HOME CARE INSTRUCTIONS  °· Take whatever pain medicine has been prescribed by your caregiver. Do not take over-the-counter pain medicine unless directed otherwise by your caregiver. °· Do not drive if you are taking narcotic pain medicines. °· Change your bandage (dressing) if necessary or as directed by your caregiver. °· Do not get your surgical cut (incision) wet. After a few days you may take quick showers (rather than baths), but keep your incision clean and dry. Covering the incision with plastic wrap while you shower should keep your incision dry. A few weeks after surgery, once your incision has healed and your caregiver says it is okay, you can take baths or go swimming. °· If you have been prescribed medicine to prevent your blood from clotting, follow the directions carefully. °· Check the area around your incision often. Look for redness and swelling. Also, look for anything leaking from your wound. You can use a mirror or have a family member inspect your incision if it is in a place where it is difficult for you to see. °· Ask your caregiver what activities you should avoid and for how long. °· Walk as much as possible. °· Do not lift anything heavier than 10 pounds (4.5 kilograms) until your caregiver says it is safe. °· Do not twist or bend for a few weeks. Try not to pull on things. Avoid sitting for long periods of time. Change positions at least every hour. °· Ask your caregiver what kinds of exercise you should do to make your back stronger and when you should begin doing these exercises. °SEEK  IMMEDIATE MEDICAL CARE IF:  °· Pain suddenly becomes much worse. °· The incision area is red, swollen, bleeding, or leaking fluid. °· Your legs or feet become increasingly painful, numb, weak, or swollen. °· You have trouble controlling urination or bowel movements. °· You have trouble breathing. °· You have chest pain. °· You have a fever. °MAKE SURE YOU: °· Understand these instructions. °· Will watch your condition. °· Will get help right away if you are not doing well or get worse. °  °This information is not intended to replace advice given to you by your health care provider. Make sure you discuss any questions you have with your health care provider. °  °Document Released: 06/30/2005 Document Revised: 01/01/2015 Document Reviewed: 05/26/2015 °Elsevier Interactive Patient Education ©2016 [employer] ° °

## 2016-09-05 NOTE — Progress Notes (Signed)
Subjective: Patient reports "I'm ready (to go home)"  Objective: Vital signs in last 24 hours: Temp:  [98.4 F (36.9 C)-99.4 F (37.4 C)] 99.4 F (37.4 C) (09/12 0519) Pulse Rate:  [77-96] 77 (09/12 0519) Resp:  [16-18] 16 (09/12 0519) BP: (116-136)/(53-67) 116/67 (09/12 0519) SpO2:  [94 %-97 %] 96 % (09/12 0519)  Intake/Output from previous day: 09/11 0701 - 09/12 0700 In: 360 [P.O.:360] Out: -  Intake/Output this shift: No intake/output data recorded.  Ambulating in room in LSO. Reporting good night's sleep and improved pain control with Robaxin last night. Incision without erythema, swelling, or drainage beneath honeycomb & Dermabond.  Good strength BLE.   Lab Results: No results for input(s): WBC, HGB, HCT, PLT in the last 72 hours. BMET No results for input(s): NA, K, CL, CO2, GLUCOSE, BUN, CREATININE, CALCIUM in the last 72 hours.  Studies/Results: No results found.  Assessment/Plan: Improving  LOS: 4 days  Ok per DrStern to d/c to home. Rx's Percocet 10/325 1 po q4-6hrs prn pain #60; Robaxin 500mg  po q6hrs prn spasm. Pt verbalizes understanding of d/c instructions. Office f/u in 3-4 weeks.   Georgiann Cockeroteat, Annaya Bangert 09/05/2016, 7:36 AM

## 2016-09-05 NOTE — Progress Notes (Signed)
Physical Therapy Treatment Patient Details Name: Ricardo Yoder MRN: [id number] DOB: 02/25/1966 Today's Date: 09/05/2016    History of Present Illness 50 yo male, with lumbar spondylolisthesis and radiculopathy, post L3-5 decompression and fusion on 09/01/16.  History of multiple spinal surgeries.    PT Comments    Patient is making good progress with PT.  From a mobility standpoint anticipate patient will be ready for DC home when medically ready.     Follow Up Recommendations  Supervision/Assistance - 24 hour     Equipment Recommendations  3in1 (PT)    Recommendations for Other Services       Precautions / Restrictions Precautions Precautions: Back Precaution Comments: Reviewed back precautions with pt and wife Required Braces or Orthoses: Spinal Brace Spinal Brace: Lumbar corset;Applied in sitting position Restrictions Weight Bearing Restrictions: No    Mobility  Bed Mobility               General bed mobility comments: sitting EOB with wife and donning assisting with donning brace upon arrival  Transfers Overall transfer level: Needs assistance Equipment used: Rolling walker (2 wheeled) Transfers: Sit to/from Stand Sit to Stand: Supervision         General transfer comment: safe hand placement; increased time to achieve erect posture; cues for technique to maintain back precuations  Ambulation/Gait Ambulation/Gait assistance: Supervision Ambulation Distance (Feet): 250 Feet Assistive device: Rolling walker (2 wheeled) Gait Pattern/deviations: Step-through pattern;Decreased stride length     General Gait Details: increased cadence today; cues for posture with ability to correct and maintain for longer periods compared to previous session   Stairs         General stair comments: pt verbalized stair management  Wheelchair Mobility    Modified Rankin (Stroke Patients Only)       Balance     Sitting balance-Leahy Scale: Good        Standing balance-Leahy Scale: Fair                      Cognition Arousal/Alertness: Awake/alert Behavior During Therapy: WFL for tasks assessed/performed Overall Cognitive Status: Within Functional Limits for tasks assessed                      Exercises      General Comments        Pertinent Vitals/Pain Pain Assessment: 0-10 Pain Score: 8  Pain Location: back and R knee Pain Descriptors / Indicators: Sore Pain Intervention(s): Limited activity within patient's tolerance;Monitored during session;Premedicated before session;Repositioned    Home Living                      Prior Function            PT Goals (current goals can now be found in the care plan section) Acute Rehab PT Goals Patient Stated Goal: go home Progress towards PT goals: Progressing toward goals    Frequency  Min 5X/week    PT Plan Current plan remains appropriate    Co-evaluation             End of Session Equipment Utilized During Treatment: Back brace Activity Tolerance: Patient tolerated treatment well Patient left: with call bell/phone within reach;with family/visitor present;in chair     Time: 4782-95621147-1202 PT Time Calculation (min) (ACUTE ONLY): 15 min  Charges:  $Gait Training: 8-22 mins  G Codes:      Derek Mound, PTA Pager: [phone]   09/05/2016, 12:06 PM

## 2016-09-05 NOTE — Progress Notes (Signed)
Pt d/c to home by car with family. Assessment stable. Prescriptions given. All questions answered. 

## 2018-03-27 IMAGING — RF DG LUMBAR SPINE 2-3V
1 series · 3 of 3 positions shown · non-contrast
Comparison: 02/23/2016

CLINICAL DATA: Lumbar spine fusion

EXAM:
DG C-ARM 61-120 MIN; LUMBAR SPINE - 2-3 VIEW

[Series 1: run · 3 of 3 slices shown]
[im 1/3]
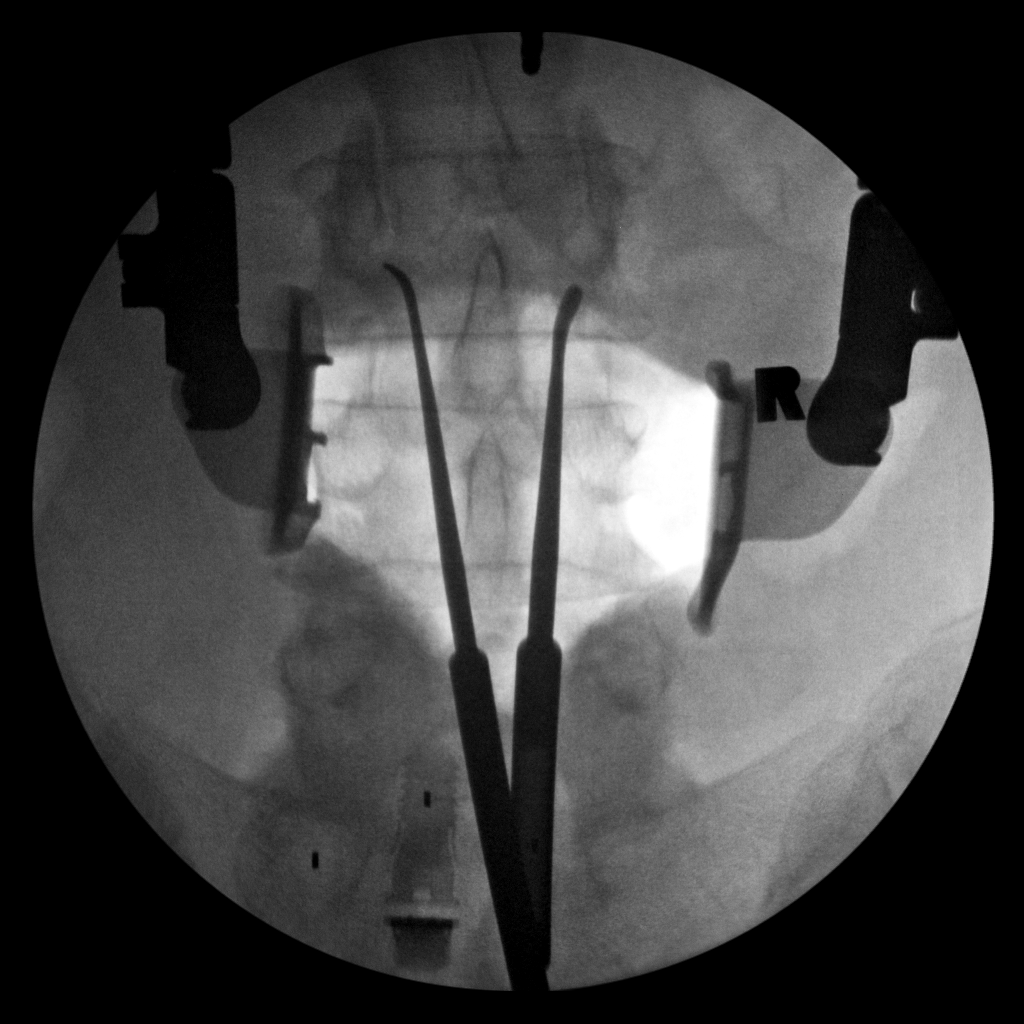
[im 2/3]
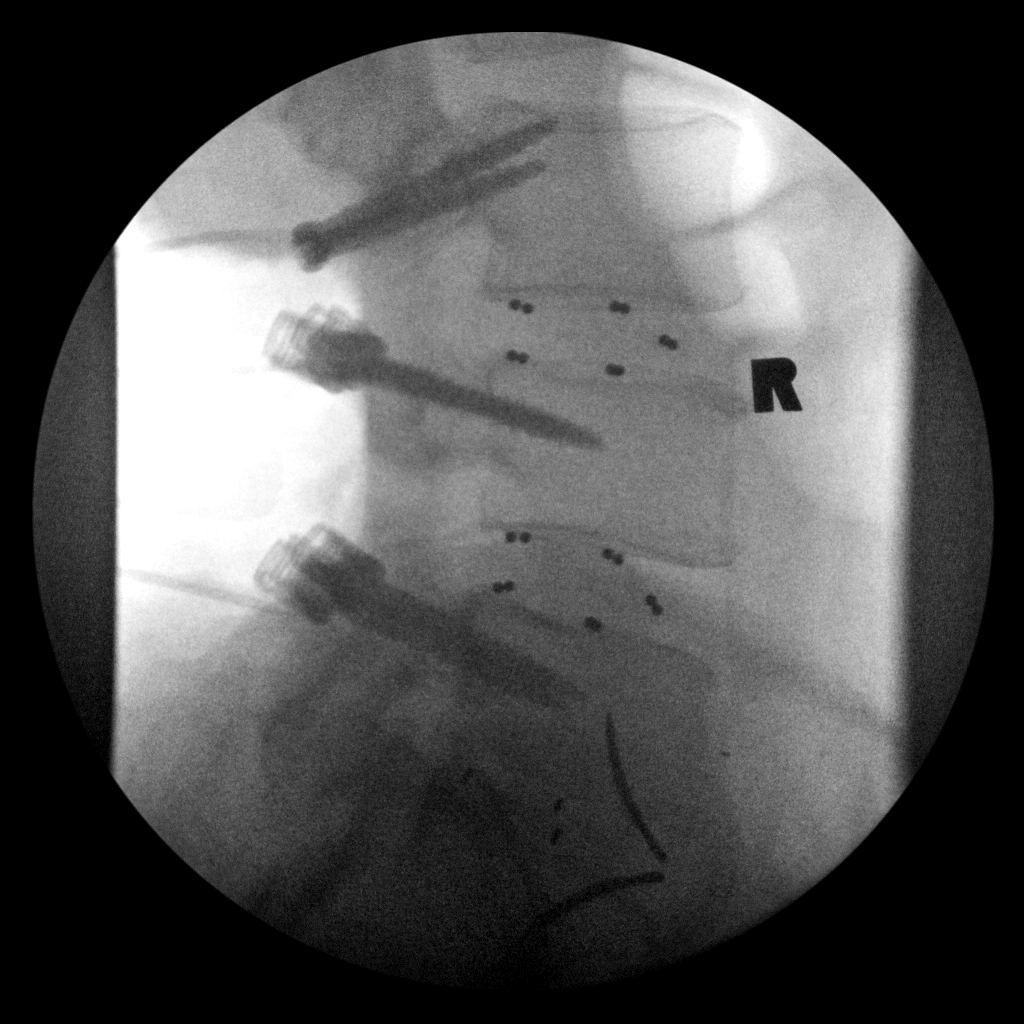
[im 3/3]
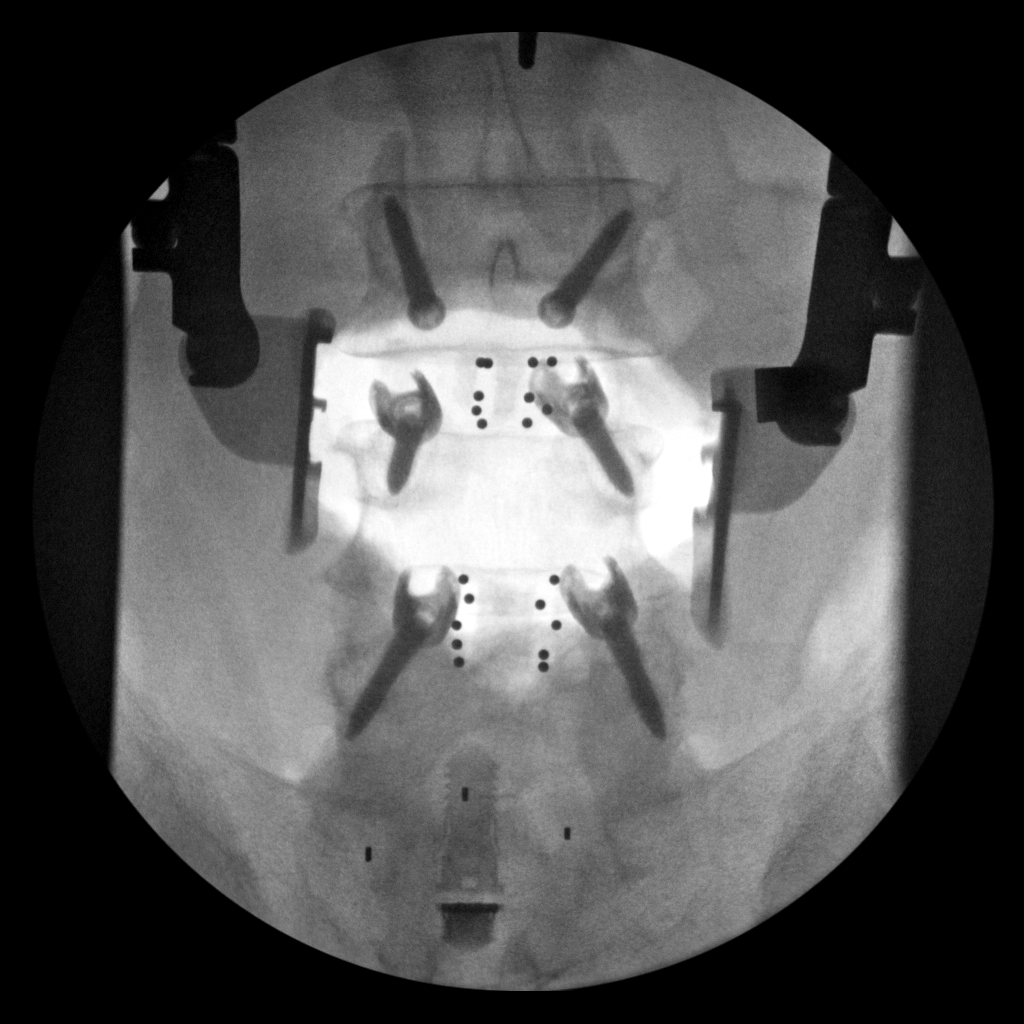

[3 of 3 positions shown; findings below may reference images not displayed]

FINDINGS: Portable intraoperative radiographs of the lumbar spine show pedicle
screw placement at L3-L4 and L5. Previous anterior lumbar disc
fusion at L5-S1 noted.
IMPRESSION: 1. Portable intraoperative radiographs show bilateral pedicle screw
placement at L3 through L5.

## 2022-11-22 ENCOUNTER — Encounter: Payer: Self-pay | Admitting: Internal Medicine

## 2022-11-22 ENCOUNTER — Ambulatory Visit (INDEPENDENT_AMBULATORY_CARE_PROVIDER_SITE_OTHER): Payer: Medicare Other | Admitting: Internal Medicine

## 2022-11-22 VITALS — BP 162/102 | HR 86 | Temp 98.7°F | Resp 20 | Ht 73.0 in | Wt 256.0 lb

## 2022-11-22 DIAGNOSIS — I10 Essential (primary) hypertension: Secondary | ICD-10-CM

## 2022-11-22 DIAGNOSIS — Z6833 Body mass index (BMI) 33.0-33.9, adult: Secondary | ICD-10-CM

## 2022-11-22 DIAGNOSIS — J418 Mixed simple and mucopurulent chronic bronchitis: Secondary | ICD-10-CM | POA: Diagnosis not present

## 2022-11-22 DIAGNOSIS — M25511 Pain in right shoulder: Secondary | ICD-10-CM

## 2022-11-22 DIAGNOSIS — K219 Gastro-esophageal reflux disease without esophagitis: Secondary | ICD-10-CM | POA: Diagnosis not present

## 2022-11-22 MED ORDER — DICLOFENAC SODIUM 50 MG PO TBEC: 50.0000 mg | DELAYED_RELEASE_TABLET | Freq: Two times a day (BID) | ORAL | 1 refills | Status: DC

## 2022-11-22 MED ORDER — LOSARTAN POTASSIUM-HCTZ 50-12.5 MG PO TABS: 1.0000 | ORAL_TABLET | Freq: Every day | ORAL | 11 refills | Status: DC

## 2022-11-22 MED ORDER — AMLODIPINE BESYLATE 10 MG PO TABS: 10.0000 mg | ORAL_TABLET | Freq: Every day | ORAL | 6 refills | Status: DC

## 2022-11-22 NOTE — Progress Notes (Signed)
New Patient Office Visit  Subjective    Patient ID: Ricardo Yoder, male    DOB: September 07, 1966  Age: 56 y.o. MRN: [id number]  CC:  Chief Complaint  Patient presents with   New Patient (Initial Visit)    HTN and right shoulder pain    HPI [name] presents to establish care He has hypertension for years and takes amlodipine 10 mg daily and lisinopril 5 mg daily. He wanted refill of his medication also today as he has few days of medicine left.   He also has COPD and takes trelegy and albuterol as needed basis. His breathing is ok with these inhalors. He smoke 1/2 PPD and wanted to quit.   He also has acid reflux and takes omeprazole.   He also is c/o right shoulder pain for 3 weeks and take tylenol but it does not help. He can not lift his shoulder above 60 degree. No injury to his shoulder.   Outpatient Encounter Medications as of 11/22/2022  Medication Sig   amLODipine (NORVASC) 10 MG tablet Take 10 mg by mouth daily.   lisinopril (ZESTRIL) 5 MG tablet Take 5 mg by mouth daily.   omeprazole (PRILOSEC) 40 MG capsule Take 40 mg by mouth daily.   methocarbamol (ROBAXIN) 500 MG tablet Take 1 tablet (500 mg total) by mouth every 6 (six) hours as needed for muscle spasms. (Patient not taking: Reported on 11/22/2022)   oxyCODONE-acetaminophen (PERCOCET) 10-325 MG tablet Take 0.5-2 tablets by mouth every 6 (six) hours as needed for pain. (Patient not taking: Reported on 11/22/2022)   oxyCODONE-acetaminophen (PERCOCET/ROXICET) 5-325 MG tablet Take 1-2 tablets by mouth every 4 (four) hours as needed for moderate pain. (Patient not taking: Reported on 11/22/2022)   No facility-administered encounter medications on file as of 11/22/2022.    Past Medical History:  Diagnosis Date   Arthritis    back , knees   Asthma    as a child   Chronic back pain    GERD (gastroesophageal reflux disease)    Hypertension     Past Surgical History:  Procedure Laterality Date   BACK SURGERY      lumbar - x 5   COLONOSCOPY     KNEE ARTHROSCOPY Right    ROTATOR CUFF REPAIR Left     Family History  Problem Relation Age of Onset   Cancer Mother     Social History   Socioeconomic History   Marital status: Married    Spouse name: Not on file   Number of children: Not on file   Years of education: Not on file   Highest education level: Not on file  Occupational History   Not on file  Tobacco Use   Smoking status: Every Day    Packs/day: 0.50    Types: Cigarettes   Smokeless tobacco: Never   Tobacco comments:    trying to quit  Substance and Sexual Activity   Alcohol use: Yes    Comment: occasional beer   Drug use: No   Sexual activity: Not on file  Other Topics Concern   Not on file  Social History Narrative   Not on file   Social Determinants of Health   Financial Resource Strain: Not on file  Food Insecurity: Not on file  Transportation Needs: Not on file  Physical Activity: Not on file  Stress: Not on file  Social Connections: Not on file  Intimate Partner Violence: Not on file    Review of Systems  Constitutional: Negative.   Respiratory:  Positive for shortness of breath.   Cardiovascular: Negative.   Musculoskeletal:  Positive for back pain and joint pain.  Neurological: Negative.         Objective    BP (!) 162/102 (BP Location: Left Arm, Patient Position: Sitting, Cuff Size: Large)   Pulse 86   Temp 98.7 F (37.1 C) (Temporal)   Resp 20   Ht 6\' 1"  (1.854 m)   Wt 256 lb (116.1 kg)   SpO2 98%   BMI 33.78 kg/m   Physical Exam Constitutional:      Appearance: He is obese.  HENT:     Head: Atraumatic.  Cardiovascular:     Rate and Rhythm: Normal rate and regular rhythm.     Heart sounds: Normal heart sounds.  Musculoskeletal:        General: Tenderness present.  Neurological:     General: No focal deficit present.     Mental Status: He is alert and oriented to person, place, and time.         Assessment & Plan:    Problem List Items Addressed This Visit   None   No follow-ups on file.   , MD

## 2022-11-22 NOTE — Patient Instructions (Addendum)
His blood pressure is high so will stop lisinopril and add losartan-Hctz. Will do BMP in 2 weeks. He will cut back salt He will walk for exercise reason.

## 2022-12-06 ENCOUNTER — Ambulatory Visit: Payer: Medicare Other | Admitting: Internal Medicine

## 2022-12-15 ENCOUNTER — Ambulatory Visit: Payer: Medicare Other | Admitting: Internal Medicine

## 2023-01-03 ENCOUNTER — Ambulatory Visit: Payer: 59 | Admitting: Internal Medicine

## 2023-01-10 ENCOUNTER — Encounter: Payer: Self-pay | Admitting: Internal Medicine

## 2023-01-10 ENCOUNTER — Ambulatory Visit: Payer: 59 | Admitting: Internal Medicine

## 2023-01-10 VITALS — BP 140/90 | HR 78 | Temp 99.4°F | Resp 18 | Ht 72.0 in | Wt 255.0 lb

## 2023-01-10 DIAGNOSIS — M5441 Lumbago with sciatica, right side: Secondary | ICD-10-CM | POA: Diagnosis not present

## 2023-01-10 DIAGNOSIS — I10 Essential (primary) hypertension: Secondary | ICD-10-CM | POA: Diagnosis not present

## 2023-01-10 DIAGNOSIS — M4316 Spondylolisthesis, lumbar region: Secondary | ICD-10-CM

## 2023-01-10 DIAGNOSIS — G8929 Other chronic pain: Secondary | ICD-10-CM | POA: Diagnosis not present

## 2023-01-10 MED ORDER — LOSARTAN POTASSIUM-HCTZ 50-12.5 MG PO TABS: 2.0000 | ORAL_TABLET | Freq: Every day | ORAL | 4 refills | Status: DC

## 2023-01-10 MED ORDER — CELECOXIB 200 MG PO CAPS: 200.0000 mg | ORAL_CAPSULE | Freq: Every day | ORAL | 2 refills | Status: DC

## 2023-01-10 MED ORDER — PREGABALIN 50 MG PO CAPS: 50.0000 mg | ORAL_CAPSULE | Freq: Three times a day (TID) | ORAL | 2 refills | Status: DC

## 2023-01-10 MED ORDER — METHYLPREDNISOLONE 4 MG PO TBPK: ORAL_TABLET | ORAL | 0 refills | Status: DC

## 2023-01-10 NOTE — Assessment & Plan Note (Signed)
I will give him Medrol Dosepak and started him on pregabalin as he could not tolerate gabapentin.

## 2023-01-10 NOTE — Assessment & Plan Note (Signed)
Better but still high so increase hyzaar to 100/25 and continue with amlodipine 10 mg daily.

## 2023-01-10 NOTE — Progress Notes (Addendum)
Office Visit  Subjective   Patient ID: Ricardo Yoder   DOB: 10/08/1966   Age: 57 y.o.   MRN: [id number]   Chief Complaint Chief Complaint  Patient presents with   Follow-up     History of Present Illness  Ricardo Yoder presents t for follow-up.  He was seen by me and his blood pressure was elevated so he was started on losartan and was continued on amlodipine.  His blood pressure is much better but still not at target control.  He attributed that to his back and shoulder pain.  He was seen by orthopedic and they have given him injection on his right shoulder that has helped but the pain came back again.  He will see them next week. He has a chronic low back pain and off-and-on he was given Percocet.  He says that pain is in right lower back and radiate to right leg.  Pain get worse with walking, feels like stabbing and many times he cannot sleep at nighttime because of pain.    He also has COPD and takes trelegy and albuterol as needed basis. His breathing is ok with these inhalors. He smoke 1/2 PPD and wanted to quit.    He also has acid reflux and takes omeprazole.     Past Medical History Past Medical History:  Diagnosis Date   Arthritis    back , knees   Asthma    as a child   Chronic back pain    GERD (gastroesophageal reflux disease)    Hypertension      Allergies Allergies  Allergen Reactions   Gabapentin Other (See Comments)    Makes him "evil"   Other Other (See Comments)    Muscle relaxers - Makes pt evil    Tramadol Other (See Comments)    Makes him "evil"     Review of Systems Review of Systems  Constitutional: Negative.   HENT: Negative.    Respiratory: Negative.    Cardiovascular: Negative.   Gastrointestinal: Negative.   Neurological: Negative.        Objective:    Vitals BP (!) 140/90 (BP Location: Left Arm, Patient Position: Sitting, Cuff Size: Large)   Pulse 78   Temp 99.4 F (37.4 C)   Resp 18   Ht 6' (1.829 m)   Wt 255 lb  (115.7 kg)   SpO2 99%   BMI 34.58 kg/m    Physical Examination Physical Exam Constitutional:      Appearance: Normal appearance. He is obese.  HENT:     Head: Normocephalic and atraumatic.  Eyes:     Extraocular Movements: Extraocular movements intact.     Pupils: Pupils are equal, round, and reactive to light.  Cardiovascular:     Rate and Rhythm: Normal rate and regular rhythm.     Heart sounds: Normal heart sounds.  Pulmonary:     Effort: Pulmonary effort is normal.     Breath sounds: Normal breath sounds.  Abdominal:     General: Bowel sounds are normal.     Palpations: Abdomen is soft.  Neurological:     General: No focal deficit present.     Mental Status: He is alert and oriented to person, place, and time.        Assessment & Plan:   Primary hypertension Better but still high so increase hyzaar to 100/25 and continue with amlodipine 10 mg daily.  Chronic right-sided low back pain with right-sided sciatica I will give him Medrol  Dosepak and started him on pregabalin as he could not tolerate gabapentin.    Return in about 3 months (around 04/11/2023).   Garwin Brothers, MD

## 2023-01-11 ENCOUNTER — Other Ambulatory Visit: Payer: Self-pay

## 2023-01-11 LAB — CMP14 + ANION GAP
ALT: 29 IU/L (ref 0–44)
AST: 24 IU/L (ref 0–40)
Albumin/Globulin Ratio: 2 (ref 1.2–2.2)
Albumin: 4.5 g/dL (ref 3.8–4.9)
Alkaline Phosphatase: 78 IU/L (ref 44–121)
Anion Gap: 13 mmol/L (ref 10.0–18.0)
BUN/Creatinine Ratio: 17 (ref 9–20)
BUN: 20 mg/dL (ref 6–24)
Bilirubin Total: 0.6 mg/dL (ref 0.0–1.2)
CO2: 26 mmol/L (ref 20–29)
Calcium: 9.4 mg/dL (ref 8.7–10.2)
Chloride: 100 mmol/L (ref 96–106)
Creatinine, Ser: 1.18 mg/dL (ref 0.76–1.27)
Globulin, Total: 2.3 g/dL (ref 1.5–4.5)
Glucose: 93 mg/dL (ref 70–99)
Potassium: 4 mmol/L (ref 3.5–5.2)
Sodium: 139 mmol/L (ref 134–144)
Total Protein: 6.8 g/dL (ref 6.0–8.5)
eGFR: 72 mL/min/{1.73_m2} (ref >59)

## 2023-01-11 MED ORDER — OMEPRAZOLE 40 MG PO CPDR: 40.0000 mg | DELAYED_RELEASE_CAPSULE | Freq: Every day | ORAL | 0 refills | Status: DC

## 2023-01-12 NOTE — Progress Notes (Signed)
Patient called.  Left message for patient to call back. His labs are good so I will mail him a letter.

## 2023-04-06 ENCOUNTER — Other Ambulatory Visit: Payer: Self-pay | Admitting: Internal Medicine

## 2023-04-09 ENCOUNTER — Other Ambulatory Visit: Payer: Self-pay | Admitting: Internal Medicine

## 2023-04-25 DIAGNOSIS — M25411 Effusion, right shoulder: Secondary | ICD-10-CM | POA: Diagnosis not present

## 2023-04-25 DIAGNOSIS — M25611 Stiffness of right shoulder, not elsewhere classified: Secondary | ICD-10-CM | POA: Diagnosis not present

## 2023-04-25 DIAGNOSIS — M25511 Pain in right shoulder: Secondary | ICD-10-CM | POA: Diagnosis not present

## 2023-04-27 ENCOUNTER — Encounter: Payer: 59 | Admitting: Internal Medicine

## 2023-05-06 ENCOUNTER — Other Ambulatory Visit: Payer: Self-pay | Admitting: Internal Medicine

## 2023-05-07 ENCOUNTER — Other Ambulatory Visit: Payer: Self-pay

## 2023-05-07 ENCOUNTER — Encounter: Payer: Self-pay | Admitting: Internal Medicine

## 2023-05-07 ENCOUNTER — Ambulatory Visit: Payer: 59 | Admitting: Internal Medicine

## 2023-05-07 VITALS — BP 148/80 | HR 82 | Temp 98.0°F | Resp 18 | Ht 72.0 in | Wt 259.2 lb

## 2023-05-07 DIAGNOSIS — G8929 Other chronic pain: Secondary | ICD-10-CM

## 2023-05-07 DIAGNOSIS — Z131 Encounter for screening for diabetes mellitus: Secondary | ICD-10-CM | POA: Diagnosis not present

## 2023-05-07 DIAGNOSIS — Z0000 Encounter for general adult medical examination without abnormal findings: Secondary | ICD-10-CM

## 2023-05-07 DIAGNOSIS — Z125 Encounter for screening for malignant neoplasm of prostate: Secondary | ICD-10-CM

## 2023-05-07 DIAGNOSIS — Z6835 Body mass index (BMI) 35.0-35.9, adult: Secondary | ICD-10-CM

## 2023-05-07 DIAGNOSIS — M5441 Lumbago with sciatica, right side: Secondary | ICD-10-CM

## 2023-05-07 DIAGNOSIS — J4520 Mild intermittent asthma, uncomplicated: Secondary | ICD-10-CM | POA: Diagnosis not present

## 2023-05-07 DIAGNOSIS — Z720 Tobacco use: Secondary | ICD-10-CM | POA: Diagnosis not present

## 2023-05-07 DIAGNOSIS — I10 Essential (primary) hypertension: Secondary | ICD-10-CM

## 2023-05-07 DIAGNOSIS — J45909 Unspecified asthma, uncomplicated: Secondary | ICD-10-CM | POA: Insufficient documentation

## 2023-05-07 MED ORDER — LOSARTAN POTASSIUM-HCTZ 100-25 MG PO TABS: 1.0000 | ORAL_TABLET | Freq: Every day | ORAL | 11 refills | Status: DC

## 2023-05-07 MED ORDER — TRELEGY ELLIPTA 100-62.5-25 MCG/ACT IN AEPB: 1.0000 | INHALATION_SPRAY | Freq: Every day | RESPIRATORY_TRACT | 6 refills | Status: DC

## 2023-05-07 MED ORDER — VARENICLINE TARTRATE 0.5 MG PO TABS: 0.5000 mg | ORAL_TABLET | Freq: Two times a day (BID) | ORAL | 1 refills | Status: DC

## 2023-05-07 MED ORDER — MONTELUKAST SODIUM 10 MG PO TABS: 10.0000 mg | ORAL_TABLET | Freq: Every day | ORAL | 2 refills | Status: DC

## 2023-05-07 NOTE — Assessment & Plan Note (Signed)
I will start trelegy and singulair 10 mg daily.

## 2023-05-07 NOTE — Assessment & Plan Note (Signed)
He wanted to quit and I will send chantix

## 2023-05-07 NOTE — Assessment & Plan Note (Signed)
He has chronic back pain and I will refer him to pain clinic for chronic pain.

## 2023-05-07 NOTE — Addendum Note (Signed)
Addended byEloisa Northern on: 05/07/2023 10:46 AM   Modules accepted: Orders

## 2023-05-07 NOTE — Progress Notes (Addendum)
Office Visit  Subjective   Patient ID: Paula Rosten   DOB: [birth year]   Age: 57 y.o.   MRN: [id number]   Chief Complaint Chief Complaint  Patient presents with   Annual Exam    Annual exam      History of Present Illness 57 years old male is here for annual physical examination. She has right shoulder surgery 8 weeks ago and is doing better. He has chronic low back pain and is getting muscle relaxant and NSAID for it.  He has hypertension and he takes losartan/Hctz and amlodipine for it but it is still not controlled.  He is disabled, he is married, he smoke less than 1/2 PPD. He is willing to try chantix as he wanted to quit smoking. He drink 1 beer once a while.  His mother has breast cancer,  He has colonoscopy few years ago and he was called to come back for repeat but he could not go but will call and make an appointment.  He denies weak urine stream. He did not have flu shot this year, he did not have COVID vaccination either.  He is has chronic back pain that radiate to right leg.and he is taking NSAID and pregabalin and he is on disability because of it.      Past Medical History Past Medical History:  Diagnosis Date   Arthritis    back , knees   Asthma    as a child   Chronic back pain    GERD (gastroesophageal reflux disease)    Hypertension      Allergies Allergies  Allergen Reactions   Gabapentin Other (See Comments)    Makes him "evil"   Other Other (See Comments)    Muscle relaxers - Makes pt evil    Tramadol Other (See Comments)    Makes him "evil"     Review of Systems Review of Systems  Constitutional: Negative.   HENT: Negative.    Eyes: Negative.   Respiratory: Negative.    Cardiovascular: Negative.   Gastrointestinal: Negative.   Genitourinary: Negative.   Musculoskeletal:  Positive for back pain.  Neurological: Negative.        Objective:    Vitals BP (!) 148/80 (BP Location: Left Arm, Patient Position: Sitting, Cuff Size:  Normal)   Pulse 82   Temp 98 F (36.7 C)   Resp 18   Ht 6' (1.829 m)   Wt 259 lb 4 oz (117.6 kg)   SpO2 97%   BMI 35.16 kg/m    Physical Examination Physical Exam Constitutional:      Appearance: Normal appearance. He is obese.  HENT:     Head: Normocephalic and atraumatic.  Eyes:     Extraocular Movements: Extraocular movements intact.     Pupils: Pupils are equal, round, and reactive to light.  Cardiovascular:     Rate and Rhythm: Normal rate and regular rhythm.     Heart sounds: Normal heart sounds.  Pulmonary:     Effort: Pulmonary effort is normal.     Breath sounds: Normal breath sounds.  Abdominal:     General: Bowel sounds are normal.     Palpations: Abdomen is soft.  Neurological:     General: No focal deficit present.     Mental Status: He is alert and oriented to person, place, and time.        Assessment & Plan:   Asthma I will start trelegy and singulair 10 mg daily.  Primary hypertension  Not well controlled so will increase losartan/Hctz 100/25 mg daily  Tobacco abuse He wanted to quit and I will send chantix   Chronic right-sided low back pain with right-sided sciatica He has chronic back pain and I will refer him to pain clinic for chronic pain.    Return in about 3 months (around 08/07/2023).   Eloisa Northern, MD

## 2023-05-07 NOTE — Assessment & Plan Note (Signed)
Not well controlled so will increase losartan/Hctz 100/25 mg daily

## 2023-05-08 LAB — CMP14 + ANION GAP
ALT: 45 IU/L — ABNORMAL HIGH (ref 0–44)
AST: 36 IU/L (ref 0–40)
Albumin/Globulin Ratio: 1.7 (ref 1.2–2.2)
Albumin: 4.3 g/dL (ref 3.8–4.9)
Alkaline Phosphatase: 79 IU/L (ref 44–121)
Anion Gap: 16 mmol/L (ref 10.0–18.0)
BUN/Creatinine Ratio: 15 (ref 9–20)
BUN: 16 mg/dL (ref 6–24)
Bilirubin Total: 0.4 mg/dL (ref 0.0–1.2)
CO2: 25 mmol/L (ref 20–29)
Calcium: 9.5 mg/dL (ref 8.7–10.2)
Chloride: 100 mmol/L (ref 96–106)
Creatinine, Ser: 1.06 mg/dL (ref 0.76–1.27)
Globulin, Total: 2.5 g/dL (ref 1.5–4.5)
Glucose: 104 mg/dL — ABNORMAL HIGH (ref 70–99)
Potassium: 3.5 mmol/L (ref 3.5–5.2)
Sodium: 141 mmol/L (ref 134–144)
Total Protein: 6.8 g/dL (ref 6.0–8.5)
eGFR: 81 mL/min/{1.73_m2} (ref >59)

## 2023-05-08 LAB — LIPID PANEL
Chol/HDL Ratio: 3.8 ratio (ref 0.0–5.0)
Cholesterol, Total: 177 mg/dL (ref 100–199)
HDL: 46 mg/dL (ref >39)
LDL Chol Calc (NIH): 104 mg/dL — ABNORMAL HIGH (ref 0–99)
Triglycerides: 155 mg/dL — ABNORMAL HIGH (ref 0–149)
VLDL Cholesterol Cal: 27 mg/dL (ref 5–40)

## 2023-05-08 LAB — CBC WITH DIFFERENTIAL/PLATELET
Basophils Absolute: 0.1 10*3/uL (ref 0.0–0.2)
Basos: 1 %
EOS (ABSOLUTE): 0.2 10*3/uL (ref 0.0–0.4)
Eos: 2 %
Hematocrit: 46.8 % (ref 37.5–51.0)
Hemoglobin: 15.9 g/dL (ref 13.0–17.7)
Immature Grans (Abs): 0 10*3/uL (ref 0.0–0.1)
Immature Granulocytes: 0 %
Lymphocytes Absolute: 2.6 10*3/uL (ref 0.7–3.1)
Lymphs: 28 %
MCH: 32.1 pg (ref 26.6–33.0)
MCHC: 34 g/dL (ref 31.5–35.7)
MCV: 95 fL (ref 79–97)
Monocytes Absolute: 0.5 10*3/uL (ref 0.1–0.9)
Monocytes: 6 %
Neutrophils Absolute: 5.9 10*3/uL (ref 1.4–7.0)
Neutrophils: 63 %
Platelets: 240 10*3/uL (ref 150–450)
RBC: 4.95 x10E6/uL (ref 4.14–5.80)
RDW: 12.2 % (ref 11.6–15.4)
WBC: 9.4 10*3/uL (ref 3.4–10.8)

## 2023-05-08 LAB — HEMOGLOBIN A1C
Est. average glucose Bld gHb Est-mCnc: 114 mg/dL
Hgb A1c MFr Bld: 5.6 % (ref 4.8–5.6)

## 2023-05-08 LAB — TSH: TSH: 0.539 u[IU]/mL (ref 0.450–4.500)

## 2023-05-08 LAB — PSA: Prostate Specific Ag, Serum: 0.5 ng/mL (ref 0.0–4.0)

## 2023-05-09 DIAGNOSIS — M25511 Pain in right shoulder: Secondary | ICD-10-CM | POA: Diagnosis not present

## 2023-05-09 DIAGNOSIS — M25611 Stiffness of right shoulder, not elsewhere classified: Secondary | ICD-10-CM | POA: Diagnosis not present

## 2023-05-09 DIAGNOSIS — M25411 Effusion, right shoulder: Secondary | ICD-10-CM | POA: Diagnosis not present

## 2023-05-09 DIAGNOSIS — L820 Inflamed seborrheic keratosis: Secondary | ICD-10-CM | POA: Diagnosis not present

## 2023-05-15 DIAGNOSIS — M25511 Pain in right shoulder: Secondary | ICD-10-CM | POA: Diagnosis not present

## 2023-05-15 DIAGNOSIS — M25411 Effusion, right shoulder: Secondary | ICD-10-CM | POA: Diagnosis not present

## 2023-05-15 DIAGNOSIS — M25611 Stiffness of right shoulder, not elsewhere classified: Secondary | ICD-10-CM | POA: Diagnosis not present

## 2023-05-17 DIAGNOSIS — M25411 Effusion, right shoulder: Secondary | ICD-10-CM | POA: Diagnosis not present

## 2023-05-17 DIAGNOSIS — M25611 Stiffness of right shoulder, not elsewhere classified: Secondary | ICD-10-CM | POA: Diagnosis not present

## 2023-05-17 DIAGNOSIS — M25511 Pain in right shoulder: Secondary | ICD-10-CM | POA: Diagnosis not present

## 2023-05-23 DIAGNOSIS — M7591 Shoulder lesion, unspecified, right shoulder: Secondary | ICD-10-CM | POA: Diagnosis not present

## 2023-05-23 DIAGNOSIS — M75111 Incomplete rotator cuff tear or rupture of right shoulder, not specified as traumatic: Secondary | ICD-10-CM | POA: Diagnosis not present

## 2023-05-23 DIAGNOSIS — M7551 Bursitis of right shoulder: Secondary | ICD-10-CM | POA: Diagnosis not present

## 2023-05-23 DIAGNOSIS — M66821 Spontaneous rupture of other tendons, right upper arm: Secondary | ICD-10-CM | POA: Diagnosis not present

## 2023-05-23 DIAGNOSIS — S46391A Other injury of muscle, fascia and tendon of triceps, right arm, initial encounter: Secondary | ICD-10-CM | POA: Diagnosis not present

## 2023-05-23 DIAGNOSIS — M25511 Pain in right shoulder: Secondary | ICD-10-CM | POA: Diagnosis not present

## 2023-05-24 ENCOUNTER — Encounter: Payer: Self-pay | Admitting: Physical Medicine & Rehabilitation

## 2023-05-28 ENCOUNTER — Other Ambulatory Visit: Payer: Self-pay

## 2023-05-28 MED ORDER — AMLODIPINE BESYLATE 10 MG PO TABS: 10.0000 mg | ORAL_TABLET | Freq: Every day | ORAL | 6 refills | Status: DC

## 2023-06-15 DIAGNOSIS — M069 Rheumatoid arthritis, unspecified: Secondary | ICD-10-CM | POA: Diagnosis not present

## 2023-06-15 DIAGNOSIS — R2689 Other abnormalities of gait and mobility: Secondary | ICD-10-CM | POA: Diagnosis not present

## 2023-06-15 DIAGNOSIS — K219 Gastro-esophageal reflux disease without esophagitis: Secondary | ICD-10-CM | POA: Diagnosis not present

## 2023-06-15 DIAGNOSIS — M75101 Unspecified rotator cuff tear or rupture of right shoulder, not specified as traumatic: Secondary | ICD-10-CM | POA: Diagnosis not present

## 2023-06-15 DIAGNOSIS — I10 Essential (primary) hypertension: Secondary | ICD-10-CM | POA: Diagnosis not present

## 2023-06-15 DIAGNOSIS — F17210 Nicotine dependence, cigarettes, uncomplicated: Secondary | ICD-10-CM | POA: Diagnosis not present

## 2023-06-15 DIAGNOSIS — J45909 Unspecified asthma, uncomplicated: Secondary | ICD-10-CM | POA: Diagnosis not present

## 2023-06-15 DIAGNOSIS — S46011A Strain of muscle(s) and tendon(s) of the rotator cuff of right shoulder, initial encounter: Secondary | ICD-10-CM | POA: Diagnosis not present

## 2023-06-15 DIAGNOSIS — G8918 Other acute postprocedural pain: Secondary | ICD-10-CM | POA: Diagnosis not present

## 2023-07-03 ENCOUNTER — Other Ambulatory Visit: Payer: Self-pay | Admitting: Internal Medicine

## 2023-07-04 ENCOUNTER — Other Ambulatory Visit: Payer: Self-pay | Admitting: Internal Medicine

## 2023-07-06 ENCOUNTER — Encounter: Payer: 59 | Attending: Physical Medicine & Rehabilitation | Admitting: Physical Medicine & Rehabilitation

## 2023-07-06 ENCOUNTER — Ambulatory Visit
Admission: RE | Admit: 2023-07-06 | Discharge: 2023-07-06 | Disposition: A | Discharge status: Home / Self Care | Payer: 59 | Source: Ambulatory Visit | Attending: Physical Medicine & Rehabilitation | Admitting: Physical Medicine & Rehabilitation

## 2023-07-06 ENCOUNTER — Encounter: Payer: Self-pay | Admitting: Physical Medicine & Rehabilitation

## 2023-07-06 VITALS — BP 138/82 | HR 92 | Ht 72.0 in | Wt 261.0 lb

## 2023-07-06 DIAGNOSIS — M961 Postlaminectomy syndrome, not elsewhere classified: Secondary | ICD-10-CM

## 2023-07-06 DIAGNOSIS — Z5181 Encounter for therapeutic drug level monitoring: Secondary | ICD-10-CM | POA: Diagnosis not present

## 2023-07-06 DIAGNOSIS — Z9889 Other specified postprocedural states: Secondary | ICD-10-CM | POA: Diagnosis not present

## 2023-07-06 MED ORDER — NORTRIPTYLINE HCL 10 MG PO CAPS: 10.0000 mg | ORAL_CAPSULE | Freq: Every day | ORAL | 1 refills | Status: DC

## 2023-07-06 NOTE — Progress Notes (Signed)
Subjective:    Patient ID: Ricardo Yoder, male    DOB: 03-26-1966, 57 y.o.   MRN: [id number]  HPI 57 year old male referred for the evaluation of back and lower extremity pain.  He has a prior history of primary hypertension chronic bronchitis GE reflux disease. Back pain and RLE pain fairly equal  The patient indicates that his pain is stabbing and aching and constant at a moderate to severe level.  It improves with rest.  He has had some pain relief in the past with narcotic analgesics.  He states that he has allergies to muscle relaxers unable to tolerate pregabalin or gabapentin.  He does not get good pain relief from acetaminophen. He is approximately 2 weeks post repeat right rotator cuff repair.  He states that he is not being prescribed any narcotic analgesics by his orthopedic surgeon. He has been on disability for approximately 20 years.  He is independent with all his self-care and mobility he is able to climb steps he does not drive he does not perform household duties. He does not perform any regular exercise program.  His orthopedic surgeon has discussed sending him to physical therapy after he comes out of his airplane splint  Right-sided toe and thigh numbness   Goals :  something to make back better   L3-4, L4-5 Interbody fusion 2017 Dr Venetia Maxon  Denies Drug or alcohol use   Lumbar spine injections in the past   Finished pain meds after Right shoulder RTC repair 2 wks ago     Pain Inventory Average Pain 8 Pain Right Now 9 My pain is constant, stabbing, and aching  In the last 24 hours, has pain interfered with the following? General activity 6 Relation with others 5 Enjoyment of life 7 What TIME of day is your pain at its worst? evening Sleep (in general) Poor  Pain is worse with: some activites Pain improves with: rest Relief from Meds: 0  walk without assistance how many minutes can you walk? 90 ability to climb steps?  yes do you drive?   no  disabled: date disabled 20 years I need assistance with the following:  household duties  numbness tingling dizziness  New pt  New pt    Family History  Problem Relation Age of Onset   Cancer Mother    Social History   Socioeconomic History   Marital status: Married    Spouse name: Not on file   Number of children: Not on file   Years of education: Not on file   Highest education level: Not on file  Occupational History   Not on file  Tobacco Use   Smoking status: Every Day    Current packs/day: 0.50    Types: Cigarettes   Smokeless tobacco: Never   Tobacco comments:    trying to quit  Vaping Use   Vaping status: Never Used  Substance and Sexual Activity   Alcohol use: Yes    Comment: occasional beer   Drug use: No   Sexual activity: Not on file  Other Topics Concern   Not on file  Social History Narrative   Not on file   Social Determinants of Health   Financial Resource Strain: Not on file  Food Insecurity: Not on file  Transportation Needs: Not on file  Physical Activity: Not on file  Stress: Not on file  Social Connections: Not on file   Past Surgical History:  Procedure Laterality Date   BACK SURGERY  lumbar - x 5   COLONOSCOPY     KNEE ARTHROSCOPY Right    ROTATOR CUFF REPAIR Left    Past Medical History:  Diagnosis Date   Arthritis    back , knees   Asthma    as a child   Chronic back pain    GERD (gastroesophageal reflux disease)    Hypertension    BP 138/82   Pulse 92   Ht 6' (1.829 m)   Wt 261 lb (118.4 kg)   SpO2 94%   BMI 35.40 kg/m   Opioid Risk Score:   Fall Risk Score:  `1  Depression screen [facility] 2/9     07/06/2023    1:02 PM 05/07/2023    9:43 AM  Depression screen PHQ 2/9  Decreased Interest 1 0  Down, Depressed, Hopeless 1 0  PHQ - 2 Score 2 0  Altered sleeping 2   Tired, decreased energy 2   Change in appetite 1   Feeling bad or failure about yourself  0   Trouble concentrating 0   Moving  slowly or fidgety/restless 0   Suicidal thoughts 0   PHQ-9 Score 7   Difficult doing work/chores Somewhat difficult       Review of Systems  Respiratory:  Positive for shortness of breath and wheezing.   Gastrointestinal:  Positive for constipation.  Musculoskeletal:  Positive for back pain.       Numbness in right foot Tingling in right leg  Neurological:  Positive for dizziness and numbness.  All other systems reviewed and are negative.      Objective:   Physical Exam Vitals and nursing note reviewed.  Constitutional:      Appearance: He is obese.  HENT:     Head: Normocephalic and atraumatic.  Eyes:     Extraocular Movements: Extraocular movements intact.     Conjunctiva/sclera: Conjunctivae normal.     Pupils: Pupils are equal, round, and reactive to light.  Musculoskeletal:     Right lower leg: No edema.     Left lower leg: No edema.  Skin:    General: Skin is warm and dry.  Neurological:     Mental Status: He is alert and oriented to person, place, and time.     Cranial Nerves: No dysarthria or facial asymmetry.     Comments: Reduced sensation in the right L5 and S1 dermatomal distribution. Motor strength is 5/5 bilateral hip flexor knee extensor ankle dorsiflexor and plantar flexor Ambulates without assistive device no evidence of toe drag or knee instability. Negative straight leg raise  Psychiatric:        Mood and Affect: Mood normal.        Behavior: Behavior normal.   Lumbar spine has no evidence of deformity no scoliosis in the thoracic spine noted. There is tenderness palpation starting at the Thoracics paraspinals going down to the lumbar paraspinals with even light palpation.  There is some tenderness in the bilateral gluteal region as well as greater trochanters of the hip         Assessment & Plan:   1.  Chronic low back pain with lumbar postlaminectomy syndrome history of L3-4 L4-5 fusion.  He has complained of some increasing pain over the  last year or so.  He has had recent right rotator cuff repair and is no longer taking narcotic analgesics postoperative. Given his surgical history will order lumbar spine x-rays.  His exercise abilities are limited right now in terms of his  shoulder and he will be starting physical therapy for his shoulder.  Nevertheless have given him some pelvic tilt exercises that he can do on his own for the next month or so.  Will also check urine drug screen to see whether narcotic analgesics can be prescribed on a chronic basis for this patient. Will start nortriptyline 10 mg nightly for his pathic pain in the right lower extremity. If no better in 1 month may consider repeat lumbar MRI

## 2023-07-06 NOTE — Patient Instructions (Addendum)
[facility] [address] get back xrays  Back Exercises These exercises help to make your trunk and back strong. They also help to keep the lower back flexible. Doing these exercises can help to prevent or lessen pain in your lower back. If you have back pain, try to do these exercises 2-3 times each day or as told by your doctor. As you get better, do the exercises once each day. Repeat the exercises more often as told by your doctor. To stop back pain from coming back, do the exercises once each day, or as told by your doctor. Do exercises exactly as told by your doctor. Stop right away if you feel sudden pain or your pain gets worse. Exercises  Pelvic tilt Do these steps 5-10 times in a row: Lie on your back on a firm bed or the floor with your legs stretched out. Bend your knees so they point up to the ceiling. Your feet should be flat on the floor. Tighten your lower belly (abdomen) muscles to press your lower back against the floor. This will make your tailbone point up to the ceiling instead of pointing down to your feet or the floor. Stay in this position for 5-10 seconds while you gently tighten your muscles and breathe evenly.     Bridges Do these steps 10 times in a row: Lie on your back on a firm bed or the floor. Bend your knees so they point up to the ceiling. Your feet should be flat on the floor. Your arms should be flat at your sides, next to your body. Tighten your butt muscles and lift your butt off the floor until your waist is almost as high as your knees. If you do not feel the muscles working in your butt and the back of your thighs, slide your feet 1-2 inches (2.5-5 cm) farther away from your butt. Stay in this position for 3-5 seconds. Slowly lower your butt to the floor, and let your butt muscles relax. If this exercise is too easy, try doing it with your arms crossed over your chest.   Contact a doctor if: Your back pain gets a lot worse when you do  an exercise. Your back pain does not get better within 2 hours after you exercise. If you have any of these problems, stop doing the exercises. Do not do them again unless your doctor says it is okay. Get help right away if: You have sudden, very bad back pain. If this happens, stop doing the exercises. Do not do them again unless your doctor says it is okay. This information is not intended to replace advice given to you by your health care provider. Make sure you discuss any questions you have with your health care provider. Document Revised: 02/23/2021 Document Reviewed: 02/23/2021 Elsevier Patient Education  2024 [employer].

## 2023-07-11 LAB — TOXASSURE SELECT,+ANTIDEPR,UR

## 2023-07-31 DIAGNOSIS — M25611 Stiffness of right shoulder, not elsewhere classified: Secondary | ICD-10-CM | POA: Diagnosis not present

## 2023-07-31 DIAGNOSIS — M25511 Pain in right shoulder: Secondary | ICD-10-CM | POA: Diagnosis not present

## 2023-08-02 ENCOUNTER — Other Ambulatory Visit: Payer: Self-pay | Admitting: Internal Medicine

## 2023-08-06 ENCOUNTER — Encounter: Payer: Self-pay | Admitting: Internal Medicine

## 2023-08-06 ENCOUNTER — Ambulatory Visit: Payer: 59 | Admitting: Internal Medicine

## 2023-08-06 VITALS — BP 142/86 | HR 71 | Temp 98.2°F | Resp 18 | Ht 73.0 in | Wt 262.0 lb

## 2023-08-06 DIAGNOSIS — J309 Allergic rhinitis, unspecified: Secondary | ICD-10-CM | POA: Diagnosis not present

## 2023-08-06 DIAGNOSIS — Z720 Tobacco use: Secondary | ICD-10-CM | POA: Diagnosis not present

## 2023-08-06 DIAGNOSIS — J4520 Mild intermittent asthma, uncomplicated: Secondary | ICD-10-CM | POA: Diagnosis not present

## 2023-08-06 DIAGNOSIS — I10 Essential (primary) hypertension: Secondary | ICD-10-CM

## 2023-08-06 DIAGNOSIS — G4700 Insomnia, unspecified: Secondary | ICD-10-CM | POA: Insufficient documentation

## 2023-08-06 MED ORDER — HYDROXYZINE HCL 50 MG PO TABS: 50.0000 mg | ORAL_TABLET | Freq: Every evening | ORAL | 3 refills | Status: DC

## 2023-08-06 MED ORDER — FLUTICASONE PROPIONATE 50 MCG/ACT NA SUSP: 1.0000 | Freq: Every day | NASAL | 2 refills | Status: AC

## 2023-08-06 NOTE — Assessment & Plan Note (Signed)
I will start nasal spray to use as needed

## 2023-08-06 NOTE — Assessment & Plan Note (Signed)
His blood pressure is not well controlled.

## 2023-08-06 NOTE — Assessment & Plan Note (Signed)
Will continue to monitor.

## 2023-08-06 NOTE — Progress Notes (Addendum)
   Office Visit  Subjective   Patient ID: Ricardo Yoder   DOB: 20-Jun-1966   Age: 57 y.o.   MRN: [id number]   Chief Complaint No chief complaint on file.    History of Present Illness 57 years old male is here for follow up. He has hypertension and he takes losartan/Hctz and amlodipine for it but it is still not controlled.   He has asthma and he says that Singulair did not help him much. He still get stuffy nose.   He says that his right shoulder have another surgery and he is going to start physical therapy as well.   He is disabled, he is married, he smoke less than 1/2 PPD. He could not stop smoking. He drink 1 beer once a while.   He says that he can not sleep at night. He is not using nortriptyline.   He is has chronic back pain that radiate to right leg.and he is taking NSAID and pregabalin and he is on disability because of it.   Past Medical History Past Medical History:  Diagnosis Date   Arthritis    back , knees   Asthma    as a child   Chronic back pain    GERD (gastroesophageal reflux disease)    Hypertension      Allergies Allergies  Allergen Reactions   Gabapentin Other (See Comments)    Makes him "evil"   Other Other (See Comments)    Muscle relaxers - Makes pt evil    Tramadol Other (See Comments)    Makes him "evil"     Review of Systems Review of Systems  Constitutional: Negative.   HENT: Negative.    Respiratory: Negative.    Cardiovascular: Negative.   Gastrointestinal: Negative.   Neurological: Negative.        Objective:    Vitals BP (!) 142/86   Pulse 71   Temp 98.2 F (36.8 C)   Resp 18   Ht 6\' 1"  (1.854 m)   Wt 262 lb (118.8 kg)   SpO2 95%   BMI 34.57 kg/m    Physical Examination Physical Exam Constitutional:      Appearance: Normal appearance. He is obese.  HENT:     Head: Normocephalic and atraumatic.  Cardiovascular:     Rate and Rhythm: Normal rate and regular rhythm.     Heart sounds: Normal heart sounds.   Pulmonary:     Effort: Pulmonary effort is normal.     Breath sounds: Normal breath sounds.  Neurological:     General: No focal deficit present.     Mental Status: He is alert and oriented to person, place, and time.        Assessment & Plan:   Primary hypertension His blood pressure is not well controlled.   Allergic rhinitis I will start nasal spray to use as needed  Asthma Will continue to monitor.  Insomnia I will start him on hydroxyzine 50 mg at night.    Return in about 3 months (around 11/06/2023).   Eloisa Northern, MD

## 2023-08-06 NOTE — Assessment & Plan Note (Signed)
I will start him on hydroxyzine 50 mg at night.

## 2023-08-08 DIAGNOSIS — M25511 Pain in right shoulder: Secondary | ICD-10-CM | POA: Diagnosis not present

## 2023-08-08 DIAGNOSIS — Z961 Presence of intraocular lens: Secondary | ICD-10-CM | POA: Diagnosis not present

## 2023-08-08 DIAGNOSIS — M25611 Stiffness of right shoulder, not elsewhere classified: Secondary | ICD-10-CM | POA: Diagnosis not present

## 2023-08-09 ENCOUNTER — Encounter: Payer: Self-pay | Admitting: Physical Medicine & Rehabilitation

## 2023-08-09 ENCOUNTER — Encounter: Payer: 59 | Attending: Physical Medicine & Rehabilitation | Admitting: Physical Medicine & Rehabilitation

## 2023-08-09 ENCOUNTER — Other Ambulatory Visit: Payer: Self-pay | Admitting: Physical Medicine & Rehabilitation

## 2023-08-09 VITALS — BP 143/90 | HR 80 | Ht 74.0 in | Wt 263.0 lb

## 2023-08-09 DIAGNOSIS — M5416 Radiculopathy, lumbar region: Secondary | ICD-10-CM | POA: Insufficient documentation

## 2023-08-09 MED ORDER — NORTRIPTYLINE HCL 25 MG PO CAPS: 25.0000 mg | ORAL_CAPSULE | Freq: Every day | ORAL | 1 refills | Status: DC

## 2023-08-09 MED ORDER — ACETAMINOPHEN-CODEINE 300-30 MG PO TABS: 1.0000 | ORAL_TABLET | Freq: Three times a day (TID) | ORAL | 1 refills | Status: DC | PRN

## 2023-08-09 NOTE — Patient Instructions (Signed)
Acetaminophen; Codeine Solution What is this medication? ACETAMINOPHEN; CODEINE (a set a MEE noe fen; KOE deen) treats moderate pain. It is prescribed when other pain medications have not worked or cannot be tolerated. It works by blocking pain signals in the brain. This medication is a combination of acetaminophen and an opioid. This medicine may be used for other purposes; ask your health care provider or pharmacist if you have questions. COMMON BRAND NAME(S): TY-PAP with Codeine, Tylenol with Codeine What should I tell my care team before I take this medication? They need to know if you have any of these conditions: Brain tumor Frequently drink alcohol Head injury Heart disease Kidney disease Liver disease Low adrenal gland function Lung disease, asthma, or breathing problems Seizures Stomach or intestine problems Substance use disorder Taken an MAOI, such as Marplan, Nardil, or Parnate in the last 14 days An unusual or allergic reaction to acetaminophen, codeine, other medications, foods, dyes, or preservatives Pregnant or trying to get pregnant Breastfeeding How should I use this medication? Take this medication by mouth. Take it as directed on the prescription label. Use a specially marked spoon or container to measure each dose. Ask your pharmacist if you do not have one. Household spoons are not accurate. If the medication upsets your stomach, take it with food. Do not take it more often than directed. A special MedGuide will be given to you by the pharmacist with each prescription and refill. Be sure to read this information carefully each time. Talk to your care team about the use of this medication in children. This medication is not for use in children less than 105 years of age. Do not give this medication to a child younger than 73 years of age after surgery to remove the tonsils and/or adenoids. Overdosage: If you think you have taken too much of this medicine contact a poison  control center or emergency room at once. NOTE: This medicine is only for you. Do not share this medicine with others. What if I miss a dose? If you miss a dose, take it as soon as you can. If it is almost time for your next dose, take only that dose. Do not take double or extra doses. What may interact with this medication? Do not take this medication with any of the following: Linezolid MAOIs, such as Marplan, Nardil, and Parnate Methylene blue Ozanimod Samidorphan This medication may also interact with the following: Alcohol Amiodarone Antihistamines for allergy, cough, and cold Atropine Certain antibiotics, such as clarithromycin, erythromycin, rifampin Certain antivirals for HIV or hepatitis Certain medications for anxiety or sleep Certain medications for bladder problems, such as oxybutynin, tolterodine Certain medications for depression, such as amitriptyline, bupropion, fluoxetine, paroxetine, sertraline, mirtazapine, trazodone Certain medications for fungal infections, such as ketoconazole, itraconazole, and posaconazole Certain medications for migraine headache, such as almotriptan, eletriptan, frovatriptan, naratriptan, rizatriptan, sumatriptan, zolmitriptan Certain medications for nausea or vomiting, such as dolasetron, granisetron, ondansetron, palonosetron Certain medications for Parkinson disease, such as benztropine, trihexyphenidyl Certain medications for seizures, such as carbamazepine, phenobarbital, phenytoin, primidone Certain medications for stomach problems, such as dicyclomine, hyoscyamine Certain medications for travel sickness, such as scopolamine Diuretics General anesthetics, such as halothane, isoflurane, methoxyflurane, propofol Ipratropium Medications that relax muscles Other medications with acetaminophen Other opioid medications for pain or cough Phenothiazines, such as chlorpromazine, mesoridazine, prochlorperazine, thioridazine Quinidine This  list may not describe all possible interactions. Give your health care provider a list of all the medicines, herbs, non-prescription drugs, or dietary supplements you  use. Also tell them if you smoke, drink alcohol, or use illegal drugs. Some items may interact with your medicine. What should I watch for while using this medication? Tell your care team if your pain does not go away, if it gets worse, or if you have new or a different type of pain. You may develop tolerance to this medication. Tolerance means that you will need a higher dose of the medication for pain relief. Tolerance is normal and is expected if you take this medication for a long time. Taking this medication with other substances that cause drowsiness, such as alcohol, benzodiazepines, or other opioids can cause serious side effects. Give your care team a list of all medications you use. They will tell you how much medication to take. Do not take more medication than directed. Call emergency services if you have problems breathing or staying awake. Children may be at higher risk for side effects. Stop giving this medication and call emergency services right away if your child has slow or noisy breathing, has confusion, is unusually sleepy, or not able to wake up. Long term use of this medication may cause your brain and body to depend on it. This can happen even when used as directed by your care team. You and your care team will work together to determine how long you will need to take this medication. If your care team wants you to stop this medication, the dose will be slowly lowered over time to reduce the risk of side effects. Naloxone is an emergency medication used for an opioid overdose. An overdose can happen if you take too much of an opioid. It can also happen if an opioid is taken with some other medications or substances such as alcohol. Know the symptoms of an overdose, such as trouble breathing, unusually tired or sleepy, or  not being able to respond or wake up. Make sure to tell caregivers and close contacts where your naloxone is stored. Make sure they know how to use it. After naloxone is given, the person giving it must call emergency services. Naloxone is a temporary treatment. Repeat doses may be needed. This medication may affect your coordination, reaction time, or judgment. Do not drive or operate machinery until you know how this medication affects you. Sit up or stand slowly to reduce the risk of dizzy or fainting spells. Drinking alcohol with this medication can increase the risk of these side effects. Do not take other medications that contain acetaminophen with this medication. Many non-prescription medications contain acetaminophen. Always read labels carefully. If you have questions, ask your care team. If you take too much acetaminophen, get medical help right away. Too much acetaminophen can be very dangerous and cause liver damage. Even if you do not have symptoms, it is important to get help right away. This medication will cause constipation. If you do not have a bowel movement for 3 days, call your care team. Your mouth may get dry. Chewing sugarless gum or sucking hard candy and drinking plenty of water may help. Contact your care team if the problem does not go away or is severe. Talk to your care team if you may be pregnant. Prolonged use of this medication during pregnancy can cause temporary withdrawal in a newborn. Talk to your care team before breastfeeding. Changes to your treatment plan may be needed. If you breastfeed while taking this medication, seek medical care right away if you notice the child has slow or noisy breathing, is unusually  sleepy or not able to wake up, or is limp. Long-term use of this medication may cause infertility. Talk to your care team if you are concerned about your fertility. What side effects may I notice from receiving this medication? Side effects that you should  report to your care team as soon as possible: Allergic reactions--skin rash, itching, hives, swelling of the face, lips, tongue, or throat CNS depression--slow or shallow breathing, shortness of breath, feeling faint, dizziness, confusion, trouble staying awake Liver injury--right upper belly pain, loss of appetite, nausea, light-colored stool, dark yellow or brown urine, yellowing skin or eyes, unusual weakness or fatigue Low adrenal gland function--nausea, vomiting, loss of appetite, unusual weakness or fatigue, dizziness Low blood pressure--dizziness, feeling faint or lightheaded, blurry vision Redness, blistering, peeling, or loosening of the skin, including inside the mouth Side effects that usually do not require medical attention (report to your care team if they continue or are bothersome): Constipation Dizziness Drowsiness Dry mouth Headache Nausea Trouble sleeping Upset stomach Vomiting This list may not describe all possible side effects. Call your doctor for medical advice about side effects. You may report side effects to FDA at 1-800-FDA-1088. Where should I keep my medication? Keep out of the reach of children and pets. This medication can be abused. Keep it in a safe place to protect it from theft. Do not share it with anyone. It is only for you. Selling or giving away this medication is dangerous and against the law. Store at room temperature between 20 and 25 degrees C (68 and 77 degrees F). Get rid of any unused medication after the expiration date. This medication may cause harm and death if taken by other adults, children, or pets. It is important to get rid of medications as soon as you no longer need it or it is expired. You can do this in two ways: Take the medication to a medication take-back program. Check with your pharmacy or law enforcement to find a location. If you cannot return the medication, check the label or package insert to see if the medication should be  thrown out in the garbage or flushed down the toilet. If you are not sure, ask your care team. If it is safe to put it in the trash, take the medication out of the container. Mix the medication with cat litter, dirt, coffee grounds, or other unwanted substance. Seal the mixture in a bag or container. Put it in the trash. NOTE: This sheet is a summary. It may not cover all possible information. If you have questions about this medicine, talk to your doctor, pharmacist, or health care provider.  2024 Elsevier/Gold Standard (2023-01-10 00:00:00)

## 2023-08-09 NOTE — Progress Notes (Signed)
Subjective:    Patient ID: Ricardo Yoder, male    DOB: 09-03-1966, 57 y.o.   MRN: [id number]  HPI 57 year old male with history of chronic low back pain status post lumbar fusionWho is here with primary complaint of right-sided low back pain right-sided thigh numbness and tingling. He has tried gabapentin in the past which she did not tolerate from a mood standpoint, he has not tolerated tramadol due to mood changes.  He was started on nortriptyline 10 mg nightly but this was not particularly effective in alleviating his lower extremity pain at night.  Urine drug screen was reviewed.  There are no controlled substances in the urine.  He has tried lumbar and pelvic exercises on a regular basis since last visit but has not had any relief.  In addition to right thigh pain he does have right thigh numbness along the lateral aspect.  No bowel or bladder dysfunction no lower extremity weakness.  He is here to discuss additional treatment options.  We reviewed the x-rays of the lumbar spine which showed no shift of his fusion hardware.  LUMBAR SPINE - 2-3 VIEW   COMPARISON:  09/01/2016   FINDINGS: Patient has had posterior fusion from L3 through L5 and anterior fusion of L5-S1. Interbody fusion devices are present at L3-4 L4-5, and L5-S1. Hardware is intact. There is no acute fracture or subluxation. There is moderate facet hypertrophy noted in the LOWER lumbar levels.   IMPRESSION: 1. Postoperative changes. 2. No acute abnormality. 3. Facet hypertrophy.     Electronically Signed   By: Norva Pavlov M.D.   On: 07/13/2023 16:10  Pain Inventory Average Pain 8 Pain Right Now 8 My pain is constant, burning, and stabbing  In the last 24 hours, has pain interfered with the following? General activity 6 Relation with others 6 Enjoyment of life 8 What TIME of day is your pain at its worst? evening Sleep (in general) Fair  Pain is worse with: walking, sitting, and standing Pain  improves with: rest Relief from Meds: 0  Family History  Problem Relation Age of Onset   Cancer Mother    Social History   Socioeconomic History   Marital status: Married    Spouse name: Not on file   Number of children: Not on file   Years of education: Not on file   Highest education level: Not on file  Occupational History   Not on file  Tobacco Use   Smoking status: Some Days    Current packs/day: 0.50    Types: Cigarettes   Smokeless tobacco: Never   Tobacco comments:    trying to quit  Vaping Use   Vaping status: Never Used  Substance and Sexual Activity   Alcohol use: Yes    Comment: occasional beer   Drug use: No   Sexual activity: Not Currently  Other Topics Concern   Not on file  Social History Narrative   Not on file   Social Determinants of Health   Financial Resource Strain: Not on file  Food Insecurity: Not on file  Transportation Needs: Not on file  Physical Activity: Not on file  Stress: Not on file  Social Connections: Not on file   Past Surgical History:  Procedure Laterality Date   BACK SURGERY     lumbar - x 5   COLONOSCOPY     KNEE ARTHROSCOPY Right    ROTATOR CUFF REPAIR Left    Past Surgical History:  Procedure Laterality Date  BACK SURGERY     lumbar - x 5   COLONOSCOPY     KNEE ARTHROSCOPY Right    ROTATOR CUFF REPAIR Left    Past Medical History:  Diagnosis Date   Arthritis    back , knees   Asthma    as a child   Chronic back pain    GERD (gastroesophageal reflux disease)    Hypertension    Ht 6\' 2"  (1.88 m)   Wt 263 lb (119.3 kg)   BMI 33.77 kg/m   Opioid Risk Score:   Fall Risk Score:  `1  Depression screen [facility] 2/9     08/09/2023   10:06 AM 07/06/2023    1:02 PM 05/07/2023    9:43 AM  Depression screen PHQ 2/9  Decreased Interest 0 1 0  Down, Depressed, Hopeless 0 1 0  PHQ - 2 Score 0 2 0  Altered sleeping  2   Tired, decreased energy  2   Change in appetite  1   Feeling bad or failure about  yourself   0   Trouble concentrating  0   Moving slowly or fidgety/restless  0   Suicidal thoughts  0   PHQ-9 Score  7   Difficult doing work/chores  Somewhat difficult      Review of Systems  Musculoskeletal:  Positive for back pain.       RT leg pain  All other systems reviewed and are negative.      Objective:   Physical Exam  General No acute distress mood and affect are appropriate. Extremities without edema Positive straight leg raise on the right side. He has reduced sensation around the right lateral thigh. Motor strength is 5/5 bilateral hip flexor knee extensor ankle dorsiflexor He is slightly forward flexed and his posture.  He has pain with lumbar flexion as well as extension with limited range of motion in both directions. No tenderness over the PSIS bilaterally.  No pain with hip range of motion Ambulates without assistive device no evidence of toe drag or knee instability.      Assessment & Plan:   1.  Lumbar postlaminectomy syndrome with chronic low back pain.  He has had increasing right lateral thigh pain corresponding to the L2 or L3 dermatomal distribution.  He is status post L3-S1 fusion with intact fusion hardware.  I suspect that he is experiencing adjacent level degeneration with the right L2 or right L3 radiculitis.  Will order MRI of the lumbar spine.  He does not have a pacemaker.  He does not require sedation.  I will see him back to review results.  Will increase nortriptyline to 25 mg nightly Start Tylenol No. 3 1 p.o. 3 times daily.  Controlled substance agreement to be signed

## 2023-08-13 DIAGNOSIS — M25611 Stiffness of right shoulder, not elsewhere classified: Secondary | ICD-10-CM | POA: Diagnosis not present

## 2023-08-13 DIAGNOSIS — M25511 Pain in right shoulder: Secondary | ICD-10-CM | POA: Diagnosis not present

## 2023-08-20 DIAGNOSIS — M25511 Pain in right shoulder: Secondary | ICD-10-CM | POA: Diagnosis not present

## 2023-08-20 DIAGNOSIS — M25611 Stiffness of right shoulder, not elsewhere classified: Secondary | ICD-10-CM | POA: Diagnosis not present

## 2023-08-22 DIAGNOSIS — M25611 Stiffness of right shoulder, not elsewhere classified: Secondary | ICD-10-CM | POA: Diagnosis not present

## 2023-08-22 DIAGNOSIS — M25511 Pain in right shoulder: Secondary | ICD-10-CM | POA: Diagnosis not present

## 2023-08-28 DIAGNOSIS — M25611 Stiffness of right shoulder, not elsewhere classified: Secondary | ICD-10-CM | POA: Diagnosis not present

## 2023-08-28 DIAGNOSIS — M25511 Pain in right shoulder: Secondary | ICD-10-CM | POA: Diagnosis not present

## 2023-08-31 ENCOUNTER — Other Ambulatory Visit: Payer: Self-pay | Admitting: Internal Medicine

## 2023-09-03 DIAGNOSIS — M25511 Pain in right shoulder: Secondary | ICD-10-CM | POA: Diagnosis not present

## 2023-09-03 DIAGNOSIS — M25611 Stiffness of right shoulder, not elsewhere classified: Secondary | ICD-10-CM | POA: Diagnosis not present

## 2023-09-06 DIAGNOSIS — M25511 Pain in right shoulder: Secondary | ICD-10-CM | POA: Diagnosis not present

## 2023-09-06 DIAGNOSIS — M25611 Stiffness of right shoulder, not elsewhere classified: Secondary | ICD-10-CM | POA: Diagnosis not present

## 2023-09-11 DIAGNOSIS — M25611 Stiffness of right shoulder, not elsewhere classified: Secondary | ICD-10-CM | POA: Diagnosis not present

## 2023-09-11 DIAGNOSIS — M25511 Pain in right shoulder: Secondary | ICD-10-CM | POA: Diagnosis not present

## 2023-09-22 ENCOUNTER — Ambulatory Visit
Admission: RE | Admit: 2023-09-22 | Discharge: 2023-09-22 | Disposition: A | Discharge status: Home / Self Care | Payer: 59 | Source: Ambulatory Visit | Attending: Physical Medicine & Rehabilitation | Admitting: Physical Medicine & Rehabilitation

## 2023-09-22 DIAGNOSIS — Z981 Arthrodesis status: Secondary | ICD-10-CM | POA: Diagnosis not present

## 2023-09-22 DIAGNOSIS — M5416 Radiculopathy, lumbar region: Secondary | ICD-10-CM | POA: Diagnosis not present

## 2023-09-22 MED ORDER — GADOPICLENOL 0.5 MMOL/ML IV SOLN
10.0000 mL | Freq: Once | INTRAVENOUS | Status: AC | PRN
  Administered 2023-09-22: 10 mL via INTRAVENOUS

## 2023-09-24 DIAGNOSIS — M25611 Stiffness of right shoulder, not elsewhere classified: Secondary | ICD-10-CM | POA: Diagnosis not present

## 2023-09-24 DIAGNOSIS — M25511 Pain in right shoulder: Secondary | ICD-10-CM | POA: Diagnosis not present

## 2023-09-27 DIAGNOSIS — M25611 Stiffness of right shoulder, not elsewhere classified: Secondary | ICD-10-CM | POA: Diagnosis not present

## 2023-09-27 DIAGNOSIS — M25511 Pain in right shoulder: Secondary | ICD-10-CM | POA: Diagnosis not present

## 2023-10-01 ENCOUNTER — Other Ambulatory Visit: Payer: Self-pay | Admitting: Internal Medicine

## 2023-10-04 ENCOUNTER — Other Ambulatory Visit: Payer: Self-pay | Admitting: Physical Medicine & Rehabilitation

## 2023-10-26 ENCOUNTER — Other Ambulatory Visit: Payer: Self-pay | Admitting: Internal Medicine

## 2023-10-30 ENCOUNTER — Other Ambulatory Visit: Payer: Self-pay | Admitting: Internal Medicine

## 2023-11-03 ENCOUNTER — Other Ambulatory Visit: Payer: Self-pay | Admitting: Internal Medicine

## 2023-11-05 ENCOUNTER — Ambulatory Visit: Payer: 59 | Admitting: Internal Medicine

## 2023-11-06 ENCOUNTER — Telehealth: Payer: Self-pay

## 2023-11-06 NOTE — Telephone Encounter (Signed)
Patient called requesting MRI results and a refill on Tylenol #3. [facility]

## 2023-11-12 NOTE — Telephone Encounter (Signed)
Patient notified and scheduled f/u

## 2023-11-26 ENCOUNTER — Ambulatory Visit: Payer: 59 | Admitting: Internal Medicine

## 2023-11-26 ENCOUNTER — Encounter: Payer: Self-pay | Admitting: Internal Medicine

## 2023-11-26 VITALS — BP 158/88 | HR 86 | Temp 98.1°F | Resp 18 | Ht 73.0 in | Wt 268.0 lb

## 2023-11-26 DIAGNOSIS — I10 Essential (primary) hypertension: Secondary | ICD-10-CM

## 2023-11-26 DIAGNOSIS — M5441 Lumbago with sciatica, right side: Secondary | ICD-10-CM | POA: Diagnosis not present

## 2023-11-26 DIAGNOSIS — J4520 Mild intermittent asthma, uncomplicated: Secondary | ICD-10-CM | POA: Diagnosis not present

## 2023-11-26 DIAGNOSIS — G8929 Other chronic pain: Secondary | ICD-10-CM

## 2023-11-26 MED ORDER — METOPROLOL TARTRATE 25 MG PO TABS: 25.0000 mg | ORAL_TABLET | Freq: Two times a day (BID) | ORAL | 11 refills | Status: AC

## 2023-11-26 MED ORDER — TRELEGY ELLIPTA 200-62.5-25 MCG/ACT IN AEPB: 1.0000 | INHALATION_SPRAY | Freq: Every day | RESPIRATORY_TRACT | 6 refills | Status: DC

## 2023-11-26 MED ORDER — MONTELUKAST SODIUM 10 MG PO TABS: 10.0000 mg | ORAL_TABLET | Freq: Every day | ORAL | 0 refills | Status: DC

## 2023-11-26 MED ORDER — ALBUTEROL SULFATE HFA 108 (90 BASE) MCG/ACT IN AERS: 2.0000 | INHALATION_SPRAY | Freq: Four times a day (QID) | RESPIRATORY_TRACT | 2 refills | Status: DC | PRN

## 2023-11-26 NOTE — Progress Notes (Signed)
Office Visit  Subjective   Patient ID: Ricardo Yoder   DOB: 1966/04/01   Age: 57 y.o.   MRN: [id number]   Chief Complaint Chief Complaint  Patient presents with   Follow-up    Follow up     History of Present Illness 57 years old male is here for follow up. He has hypertension and he takes losartan/Hctz and amlodipine for it but it is still not controlled.    He has asthma and he says that Singulair did not help him much. He still get stuffy nose.    He says that his right shoulder have another surgery and he is going to start physical therapy as well.    He is disabled, he is married, he smoke less than 1/2 PPD. He could not stop smoking. He drink 1 beer once a while.    He says that he can not sleep at night. He is not using nortriptyline.    He is has chronic back pain that radiate to right leg.and he is taking NSAID and pregabalin and he is on disability because of it.   Past Medical History Past Medical History:  Diagnosis Date   Arthritis    back , knees   Asthma    as a child   Chronic back pain    GERD (gastroesophageal reflux disease)    Hypertension      Allergies Allergies  Allergen Reactions   Gabapentin Other (See Comments)    Makes him "evil"   Other Other (See Comments)    Muscle relaxers - Makes pt evil    Tramadol Other (See Comments)    Makes him "evil"     Review of Systems Review of Systems  Constitutional: Negative.   HENT: Negative.    Respiratory: Negative.    Cardiovascular: Negative.   Gastrointestinal: Negative.   Musculoskeletal:  Positive for back pain and joint pain.  Neurological: Negative.        Objective:    Vitals BP (!) 158/88 (BP Location: Left Arm, Patient Position: Sitting, Cuff Size: Normal)   Pulse 86   Temp 98.1 F (36.7 C)   Resp 18   Ht 6\' 1"  (1.854 m)   Wt 268 lb (121.6 kg)   SpO2 92%   BMI 35.36 kg/m    Physical Examination Physical Exam Constitutional:      Appearance: Normal appearance.   HENT:     Head: Normocephalic and atraumatic.  Cardiovascular:     Rate and Rhythm: Normal rate and regular rhythm.     Heart sounds: Normal heart sounds.  Pulmonary:     Effort: Pulmonary effort is normal.     Breath sounds: Normal breath sounds.  Abdominal:     General: Bowel sounds are normal.     Palpations: Abdomen is soft.  Neurological:     General: No focal deficit present.     Mental Status: He is alert and oriented to person, place, and time.        Assessment & Plan:   Primary hypertension His blood pressure is high, I will add metoprolol 25 mg twice a day  Asthma Not controlled with current medications, I will increase the dose of trelegy to controlled his symptoms.   Chronic right-sided low back pain with right-sided sciatica He follows with rehab physician and they have done MRI of his back. Diclofenac sodium is also the reason for high BP but he says that he can not take tramadol.  Return in about 1 month (around 12/27/2023).   Eloisa Northern, MD

## 2023-11-26 NOTE — Assessment & Plan Note (Signed)
Not controlled with current medications, I will increase the dose of trelegy to controlled his symptoms.

## 2023-11-26 NOTE — Assessment & Plan Note (Signed)
He follows with rehab physician and they have done MRI of his back. Diclofenac sodium is also the reason for high BP but he says that he can not take tramadol.

## 2023-11-26 NOTE — Assessment & Plan Note (Signed)
His blood pressure is high, I will add metoprolol 25 mg twice a day

## 2023-11-29 ENCOUNTER — Encounter: Payer: 59 | Admitting: Physical Medicine & Rehabilitation

## 2023-12-01 ENCOUNTER — Other Ambulatory Visit: Payer: Self-pay | Admitting: Internal Medicine

## 2023-12-10 ENCOUNTER — Other Ambulatory Visit: Payer: Self-pay

## 2023-12-10 ENCOUNTER — Ambulatory Visit: Payer: 59

## 2023-12-10 DIAGNOSIS — S46011A Strain of muscle(s) and tendon(s) of the rotator cuff of right shoulder, initial encounter: Secondary | ICD-10-CM | POA: Diagnosis not present

## 2023-12-10 MED ORDER — HYDROXYZINE HCL 50 MG PO TABS: 50.0000 mg | ORAL_TABLET | Freq: Every evening | ORAL | 3 refills | Status: AC

## 2023-12-10 NOTE — Progress Notes (Signed)
Nurse Visit Blood Pressure Check

## 2023-12-13 ENCOUNTER — Encounter: Payer: 59 | Attending: Physical Medicine & Rehabilitation | Admitting: Physical Medicine & Rehabilitation

## 2023-12-13 ENCOUNTER — Encounter: Payer: Self-pay | Admitting: Physical Medicine & Rehabilitation

## 2023-12-13 VITALS — BP 158/97 | HR 76 | Ht 73.0 in | Wt 263.0 lb

## 2023-12-13 DIAGNOSIS — M5416 Radiculopathy, lumbar region: Secondary | ICD-10-CM | POA: Insufficient documentation

## 2023-12-13 MED ORDER — DULOXETINE HCL 20 MG PO CPEP: 20.0000 mg | ORAL_CAPSULE | Freq: Every day | ORAL | 1 refills | Status: DC

## 2023-12-13 NOTE — Progress Notes (Signed)
Subjective:    Patient ID: Ricardo Yoder, male    DOB: 04-Jul-1966, 57 y.o.   MRN: [id number] 57 year old male with history of chronic low back pain status post lumbar fusionWho is here with primary complaint of right-sided low back pain right-sided thigh numbness and tingling. He has tried gabapentin in the past which she did not tolerate from a mood standpoint, he has not tolerated tramadol due to mood changes.  He was started on nortriptyline 10 mg nightly but this was not particularly effective in alleviating his lower extremity pain at night.  Urine drug screen was reviewed.  There are no controlled substances in the urine.  He has tried lumbar and pelvic exercises on a regular basis since last visit but has not had any relief.  In addition to right thigh pain he does have right thigh numbness along the lateral aspect.  No bowel or bladder dysfunction no lower extremity weakness.  He is here to discuss additional treatment options.  We reviewed the x-rays of the lumbar spine which showed no shift of his fusion hardware. HPI 57 year old male with chief complaint of pain going down the right thigh Onset was greater than 6 months ago Prior history significant for L3-S1 fusion. Patient had repeat MRI approximately 2-1/2 months ago.  He is here to go over the results.  He had a cancellation which delayed follow-up appointment. No improvement in his symptoms.  He has failed exercise therapy as well as medication management including a trial of nortriptyline as well as a trial of Tylenol with codeine. MRI LUMBAR SPINE WITHOUT AND WITH CONTRAST   TECHNIQUE: Multiplanar and multiecho pulse sequences of the lumbar spine were obtained without and with intravenous contrast.   CONTRAST:  10 cc Vueway   COMPARISON:  Marked spine radiographs 07/06/2023, lumbar spine MRI 02/23/2016   FINDINGS: Segmentation: Standard; the lowest formed disc space is designated L5-S1.   Alignment: There is  straightening of the normal lumbar lordosis. There is no antero or retrolisthesis.   Vertebrae: Vertebral body heights are preserved. Background marrow signal is normal. There is no suspicious marrow signal abnormality or marrow edema. There is no abnormal marrow enhancement. The patient is status post posterior instrumented fusion from L3 through L5 and anterior fusion at L5-S1.   Conus medullaris and cauda equina: Conus extends to the L1 level. Conus and cauda equina appear normal.   Paraspinal and other soft tissues: Unremarkable.   Disc levels:   T12-L1: No significant spinal canal or neural foraminal stenosis.   L1-L2: Mild facet arthropathy without significant spinal canal or neural foraminal stenosis.   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 The spinal canal stenosis is worsened compared to the preoperative study from 2017.   L3-L4: Status post posterior instrumented fusion and decompression since 2017 without residual spinal canal or neural foraminal stenosis.   L4-L5: Status post posterior instrumented fusion and decompression since 2017 without residual spinal canal or neural foraminal stenosis.   L5-S1: Status post anterior fusion and probable right laminectomy since 2017. There is heterogeneous enhancing material in the right aspect of the spinal canal (21-43, 44, 46) Which may affect the traversing S1 nerve root. No significant residual spinal canal or neural foraminal stenosis.   IMPRESSION: 1. Since 2017, there has been interval posterior fusion from L3 through L5 and  anterior fusion at L5-S1 without residual spinal canal or neural foraminal stenosis; however, there is enhancing material in the right lateral aspect of the spinal canal at  L5-S1 likely reflecting postoperative scar which could reflect the traversing S1 nerve root. 2. 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     Electronically Signed   By: Lesia Hausen M.D.   On: 10/12/2023 10:58 Pain Inventory Average Pain 8 Pain Right Now 10 My pain is intermittent, sharp, and burning  In the last 24 hours, has pain interfered with the following? General activity 6 Relation with others 7 Enjoyment of life 7 What TIME of day is your pain at its worst? night Sleep (in general) Fair  Pain is worse with: walking, inactivity, and some activites Pain improves with: rest Relief from Meds: 4  Family History  Problem Relation Age of Onset   Cancer Mother    Social History   Socioeconomic History   Marital status: Married    Spouse name: Not on file   Number of children: Not on file   Years of education: Not on file   Highest education level: Not on file  Occupational History   Not on file  Tobacco Use   Smoking status: Some Days    Current packs/day: 0.50    Types: Cigarettes   Smokeless tobacco: Never   Tobacco comments:    trying to quit  Vaping Use   Vaping status: Never Used  Substance and Sexual Activity   Alcohol use: Not Currently    Comment: occasional beer   Drug use: No   Sexual activity: Not Currently  Other Topics Concern   Not on file  Social History Narrative   Not on file   Social Drivers of Health   Financial Resource Strain: Not on file  Food Insecurity: Not on file  Transportation Needs: Not on file  Physical Activity: Not on file  Stress: Not on file  Social Connections: Not on file   Past Surgical History:  Procedure Laterality Date   BACK SURGERY     lumbar - x 5   COLONOSCOPY     KNEE ARTHROSCOPY Right    ROTATOR CUFF REPAIR Left    Past Surgical  History:  Procedure Laterality Date   BACK SURGERY     lumbar - x 5   COLONOSCOPY     KNEE ARTHROSCOPY Right    ROTATOR CUFF REPAIR Left    Past Medical History:  Diagnosis Date   Arthritis    back , knees   Asthma    as a child   Chronic back pain    GERD (gastroesophageal reflux disease)    Hypertension    BP (!) 160/104   Pulse 64   Ht 6\' 1"  (1.854 m)   Wt 263 lb (119.3 kg)   SpO2 96%   BMI 34.70 kg/m   Opioid Risk Score:   Fall Risk Score:  `1  Depression screen [facility] 2/9     12/13/2023    9:20 AM 08/09/2023   10:06 AM 07/06/2023    1:02 PM 05/07/2023    9:43 AM  Depression screen PHQ 2/9  Decreased Interest 0 0 1 0  Down, Depressed, Hopeless 0 0 1 0  PHQ - 2 Score 0 0 2 0  Altered sleeping   2   Tired, decreased energy   2  Change in appetite   1   Feeling bad or failure about yourself    0   Trouble concentrating   0   Moving slowly or fidgety/restless   0   Suicidal thoughts   0   PHQ-9 Score   7   Difficult doing work/chores   Somewhat difficult    8  Review of Systems  Musculoskeletal:  Positive for back pain.  All other systems reviewed and are negative.     Objective:   Physical Exam  General no acute distress Mood and affect appropriate Lumbar spine range of motion limited to 50% flexion extension lateral bending and rotation Negative straight leg raise. Motor strength is 5/5 bilateral hip flexor knee extensor ankle dorsiflexion Sensation reduced lateral thigh on the right side no sensory loss in the anterior or medial thigh.       Assessment & Plan:  1.  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  Has failed medication management including narcotic analgesics, tricyclic medications as well as gabapentin.  Will trial duloxetine, scheduled for right L2-3 transforaminal injection under fluoroscopic guidance.

## 2023-12-13 NOTE — Patient Instructions (Signed)
Duloxetine Delayed-Release Capsules What is this medication? DULOXETINE (doo LOX e teen) treats depression, anxiety, fibromyalgia, and certain types of chronic pain such as nerve, bone, or joint pain. It increases the amount of serotonin and norepinephrine in the brain, hormones that help regulate mood and pain. It belongs to a group of medications called SNRIs. This medicine may be used for other purposes; ask your health care provider or pharmacist if you have questions. COMMON BRAND NAME(S): Cymbalta, Drizalma, Irenka What should I tell my care team before I take this medication? They need to know if you have any of these conditions: Bipolar disorder Glaucoma High blood pressure Kidney disease Liver disease Seizures Suicidal thoughts, plans, or attempt by you or a family member Take medications that treat or prevent blood clots Taken an MAOI, such as Carbex, Eldepryl, Marplan, Nardil, or Parnate in the last 14 days Trouble passing urine An unusual reaction to duloxetine, other medications, foods, dyes, or preservatives Pregnant or trying to get pregnant Breastfeeding How should I use this medication? Take this medication by mouth with water. Take it as directed on the prescription label at the same time every day. Do not cut, crush, or chew this medication. Swallow the capsules whole. Some capsules may be opened and sprinkled on applesauce. Check with your care team or pharmacist if you are not sure. You can take this medication with or without food. Do not take your medication more often than directed. Do not stop taking this medication suddenly except upon the advice of your care team. Stopping this medication too quickly may cause serious side effects or your condition may worsen. A special MedGuide will be given to you by the pharmacist with each prescription and refill. Be sure to read this information carefully each time. Talk to your care team about the use of this medication in  children. While it may be prescribed for children as young as 7 years for selected conditions, precautions do apply. Overdosage: If you think you have taken too much of this medicine contact a poison control center or emergency room at once. NOTE: This medicine is only for you. Do not share this medicine with others. What if I miss a dose? If you miss a dose, take it as soon as you can. If it is almost time for your next dose, take only that dose. Do not take double or extra doses. What may interact with this medication? Do not take this medication with any of the following: Desvenlafaxine Levomilnacipran Linezolid MAOIs, such as Carbex, Eldepryl, Emsam, Marplan, Nardil, and Parnate Methylene blue (injected into a vein) Milnacipran Safinamide Thioridazine Venlafaxine Viloxazine This medication may also interact with the following: Alcohol Amphetamines Aspirin and aspirin-like medications Certain antibiotics, such as ciprofloxacin and enoxacin Certain medications for blood pressure, heart disease, irregular heart beat Certain medications for mental health conditions Certain medications for migraine headache, such as almotriptan, eletriptan, frovatriptan, naratriptan, rizatriptan, sumatriptan, zolmitriptan Certain medications that treat or prevent blood clots, such as warfarin, enoxaparin, and dalteparin Cimetidine Fentanyl Lithium NSAIDS, medications for pain and inflammation, such as ibuprofen or naproxen Phentermine Procarbazine Rasagiline Sibutramine St. John's Wort Theophylline Tramadol Tryptophan This list may not describe all possible interactions. Give your health care provider a list of all the medicines, herbs, non-prescription drugs, or dietary supplements you use. Also tell them if you smoke, drink alcohol, or use illegal drugs. Some items may interact with your medicine. What should I watch for while using this medication? Tell your care team if your  symptoms do not  get better or if they get worse. Visit your care team for regular checks on your progress. Because it may take several weeks to see the full effects of this medication, it is important to continue your treatment as prescribed by your care team. This medication may cause serious skin reactions. They can happen weeks to months after starting the medication. Contact your care team right away if you notice fevers or flu-like symptoms with a rash. The rash may be red or purple and then turn into blisters or peeling of the skin. You may also notice a red rash with swelling of the face, lips, or lymph nodes in your neck or under your arms. Watch for new or worsening thoughts of suicide or depression. This includes sudden changes in mood, behaviors, or thoughts. These changes can happen at any time but are more common in the beginning of treatment or after a change in dose. Call your care team right away if you experience these thoughts or worsening depression. This medication may cause mood and behavior changes, such as anxiety, nervousness, irritability, hostility, restlessness, excitability, hyperactivity, or trouble sleeping. These changes can happen at any time but are more common in the beginning of treatment or after a change in dose. Call your care team right away if you notice any of these symptoms. This medication may affect your coordination, reaction time, or judgment. Do not drive or operate machinery until you know how this medication affects you. Sit up or stand slowly to reduce the risk of dizzy or fainting spells. Drinking alcohol with this medication can increase the risk of these side effects. This medication may increase blood sugar. The risk may be higher in patients who already have diabetes. Ask your care team what you can do to lower your risk of diabetes while taking this medication. This medication can cause an increase in blood pressure. This medication can also cause a sudden drop in your  blood pressure, which may make you feel faint and increase the chance of a fall. These effects are most common when you first start the medication or when the dose is increased, or during use of other medications that can cause a sudden drop in blood pressure. Check with your care team for instructions on monitoring your blood pressure while taking this medication. Your mouth may get dry. Chewing sugarless gum or sucking hard candy and drinking plenty of water may help. Contact your care team if the problem does not go away or is severe. What side effects may I notice from receiving this medication? Side effects that you should report to your care team as soon as possible: Allergic reactions--skin rash, itching, hives, swelling of the face, lips, tongue, or throat Bleeding--bloody or black, tar-like stools, red or dark brown urine, vomiting blood or brown material that looks like coffee grounds, small, red or purple spots on skin, unusual bleeding or bruising Increase in blood pressure Liver injury--right upper belly pain, loss of appetite, nausea, light-colored stool, dark yellow or brown urine, yellowing skin or eyes, unusual weakness or fatigue Low sodium level--muscle weakness, fatigue, dizziness, headache, confusion Redness, blistering, peeling, or loosening of the skin, including inside the mouth Serotonin syndrome--irritability, confusion, fast or irregular heartbeat, muscle stiffness, twitching muscles, sweating, high fever, seizures, chills, vomiting, diarrhea Sudden eye pain or change in vision such as blurry vision, seeing halos around lights, vision loss Thoughts of suicide or self-harm, worsening mood, feelings of depression Trouble passing urine Side effects that  usually do not require medical attention (report to your care team if they continue or are bothersome): Change in sex drive or performance Constipation Diarrhea Dizziness Dry mouth Excessive sweating Loss of  appetite Nausea Vomiting This list may not describe all possible side effects. Call your doctor for medical advice about side effects. You may report side effects to FDA at 1-800-FDA-1088. Where should I keep my medication? Keep out of the reach of children and pets. Store at room temperature between 15 and 30 degrees C (59 to 86 degrees F). Get rid of any unused medication after the expiration date. To get rid of medications that are no longer needed or have expired: Take the medication to a medication take-back program. Check with your pharmacy or law enforcement to find a location. If you cannot return the medication, check the label or package insert to see if the medication should be thrown out in the garbage or flushed down the toilet. If you are not sure, ask your care team. If it is safe to put it in the trash, take the medication out of the container. Mix the medication with cat litter, dirt, coffee grounds, or other unwanted substance. Seal the mixture in a bag or container. Put it in the trash. NOTE: This sheet is a summary. It may not cover all possible information. If you have questions about this medicine, talk to your doctor, pharmacist, or health care provider.  2024 Elsevier/Gold Standard (2022-09-14 00:00:00)

## 2023-12-26 ENCOUNTER — Other Ambulatory Visit: Payer: Self-pay | Admitting: Internal Medicine

## 2023-12-27 ENCOUNTER — Other Ambulatory Visit: Payer: Self-pay

## 2023-12-31 ENCOUNTER — Ambulatory Visit: Payer: 59 | Admitting: Internal Medicine

## 2023-12-31 ENCOUNTER — Encounter: Payer: Self-pay | Admitting: Internal Medicine

## 2023-12-31 VITALS — BP 160/88 | HR 77 | Temp 97.1°F | Resp 18 | Ht 72.0 in | Wt 263.0 lb

## 2023-12-31 DIAGNOSIS — M5441 Lumbago with sciatica, right side: Secondary | ICD-10-CM

## 2023-12-31 DIAGNOSIS — I10 Essential (primary) hypertension: Secondary | ICD-10-CM | POA: Diagnosis not present

## 2023-12-31 DIAGNOSIS — G8929 Other chronic pain: Secondary | ICD-10-CM

## 2023-12-31 DIAGNOSIS — J4520 Mild intermittent asthma, uncomplicated: Secondary | ICD-10-CM

## 2023-12-31 NOTE — Progress Notes (Signed)
   Office Visit  Subjective   Patient ID: Ricardo Yoder   DOB: [birth year]   Age: 58 y.o.   MRN: [id number]   Chief Complaint Chief Complaint  Patient presents with   Follow-up    1 month follow up     History of Present Illness 58 years old male is here for follow up. He has hypertension and he takes losartan /Hctz and amlodipine  and lopressor  25 mg daily even though he was suppose to take it twice a day. He check his blood pressure 130/84 today but here is 140/98.    He has asthma and he says that Singulair  did not help him much. He still get stuffy nose.    He is disabled, he is married, he smoke less than 1/2 PPD. He could not stop smoking. He drink 1 beer once a while.   Past Medical History Past Medical History:  Diagnosis Date   Arthritis    back , knees   Asthma    as a child   Chronic back pain    GERD (gastroesophageal reflux disease)    Hypertension      Allergies Allergies  Allergen Reactions   Gabapentin Other (See Comments)    Makes him evil   Other Other (See Comments)    Muscle relaxers - Makes pt evil    Tramadol Other (See Comments)    Makes him evil     Review of Systems Review of Systems  Constitutional: Negative.   HENT: Negative.    Respiratory: Negative.    Cardiovascular: Negative.   Gastrointestinal: Negative.   Neurological: Negative.        Objective:    Vitals BP (!) 160/88 (BP Location: Left Arm, Patient Position: Sitting)   Pulse 77   Temp (!) 97.1 F (36.2 C)   Resp 18   Ht 6' (1.829 m)   Wt 263 lb (119.3 kg)   SpO2 95%   BMI 35.67 kg/m    Physical Examination Physical Exam Constitutional:      Appearance: Normal appearance.  HENT:     Head: Normocephalic and atraumatic.  Cardiovascular:     Rate and Rhythm: Normal rate and regular rhythm.     Heart sounds: Normal heart sounds.  Pulmonary:     Effort: Pulmonary effort is normal.     Breath sounds: Normal breath sounds.  Neurological:     General: No  focal deficit present.     Mental Status: He is alert and oriented to person, place, and time.        Assessment & Plan:   Primary hypertension Better but lower number is still high.   Chronic right-sided low back pain with right-sided sciatica He was given injection in his back but that did not made any difference.   Asthma Better since his medications were adjusted.     Return in about 3 months (around 03/30/2024).   Roetta Dare, MD

## 2023-12-31 NOTE — Assessment & Plan Note (Signed)
 He was given injection in his back but that did not made any difference.

## 2023-12-31 NOTE — Assessment & Plan Note (Signed)
 Better but lower number is still high.

## 2023-12-31 NOTE — Assessment & Plan Note (Signed)
 Better since his medications were adjusted.

## 2024-01-02 DIAGNOSIS — M6281 Muscle weakness (generalized): Secondary | ICD-10-CM | POA: Diagnosis not present

## 2024-01-02 DIAGNOSIS — M25511 Pain in right shoulder: Secondary | ICD-10-CM | POA: Diagnosis not present

## 2024-01-02 DIAGNOSIS — M25611 Stiffness of right shoulder, not elsewhere classified: Secondary | ICD-10-CM | POA: Diagnosis not present

## 2024-01-04 DIAGNOSIS — M6281 Muscle weakness (generalized): Secondary | ICD-10-CM | POA: Diagnosis not present

## 2024-01-04 DIAGNOSIS — M25511 Pain in right shoulder: Secondary | ICD-10-CM | POA: Diagnosis not present

## 2024-01-04 DIAGNOSIS — M25611 Stiffness of right shoulder, not elsewhere classified: Secondary | ICD-10-CM | POA: Diagnosis not present

## 2024-01-10 DIAGNOSIS — M6281 Muscle weakness (generalized): Secondary | ICD-10-CM | POA: Diagnosis not present

## 2024-01-10 DIAGNOSIS — M25511 Pain in right shoulder: Secondary | ICD-10-CM | POA: Diagnosis not present

## 2024-01-10 DIAGNOSIS — M25611 Stiffness of right shoulder, not elsewhere classified: Secondary | ICD-10-CM | POA: Diagnosis not present

## 2024-01-14 DIAGNOSIS — M6281 Muscle weakness (generalized): Secondary | ICD-10-CM | POA: Diagnosis not present

## 2024-01-14 DIAGNOSIS — M25611 Stiffness of right shoulder, not elsewhere classified: Secondary | ICD-10-CM | POA: Diagnosis not present

## 2024-01-14 DIAGNOSIS — M25511 Pain in right shoulder: Secondary | ICD-10-CM | POA: Diagnosis not present

## 2024-01-22 ENCOUNTER — Encounter: Payer: 59 | Admitting: Physical Medicine & Rehabilitation

## 2024-01-30 DIAGNOSIS — M25511 Pain in right shoulder: Secondary | ICD-10-CM | POA: Diagnosis not present

## 2024-01-30 DIAGNOSIS — M25611 Stiffness of right shoulder, not elsewhere classified: Secondary | ICD-10-CM | POA: Diagnosis not present

## 2024-01-30 DIAGNOSIS — M6281 Muscle weakness (generalized): Secondary | ICD-10-CM | POA: Diagnosis not present

## 2024-02-11 DIAGNOSIS — M7551 Bursitis of right shoulder: Secondary | ICD-10-CM | POA: Diagnosis not present

## 2024-02-11 DIAGNOSIS — S46011A Strain of muscle(s) and tendon(s) of the rotator cuff of right shoulder, initial encounter: Secondary | ICD-10-CM | POA: Diagnosis not present

## 2024-02-14 NOTE — Progress Notes (Signed)
  PROCEDURE RECORD [facility]   Name: Ricardo Yoder DOB:Jan 22, 1966 MRN: [id number]  Date:02/14/2024  Physician: Claudette Laws, MD    Nurse/CMA: Charise Carwin MA  Allergies:  Allergies  Allergen Reactions   Gabapentin Other (See Comments)    Makes him "evil"   Other Other (See Comments)    Muscle relaxers - Makes pt evil    Tramadol Other (See Comments)    Makes him "evil"    Consent Signed: Yes.    Is patient diabetic? No.  CBG today? N/a  Pregnant: No. LMP: No LMP for male patient. (age 1-55)  Anticoagulants: no Anti-inflammatory: no Antibiotics: no  Procedure: Right L2-3 Transforaminal ESI  Position: Prone Start Time: 10:31 am End Time: 10:35 am  Fluoro Time: 23  RN/CMA Edouard Gikas MA Tanner Vigna MA    Time 9:33 AM 10:39 AM 10:47 AM   BP 144/89 157/79 138/84   Pulse 70 64 60   Respirations 16 16 16    O2 Sat 96 98 98   S/S 6 6 6    Pain Level 9/10 8/10 8/10    D/C home with Wife, patient A & O X 3, D/C instructions reviewed, and sits independently.

## 2024-02-14 NOTE — Telephone Encounter (Signed)
error 

## 2024-02-19 ENCOUNTER — Encounter: Payer: Self-pay | Admitting: Physical Medicine & Rehabilitation

## 2024-02-19 ENCOUNTER — Encounter: Payer: 59 | Attending: Physical Medicine & Rehabilitation | Admitting: Physical Medicine & Rehabilitation

## 2024-02-19 VITALS — BP 144/89 | HR 70 | Temp 97.9°F | Ht 72.0 in | Wt 257.0 lb

## 2024-02-19 DIAGNOSIS — M5416 Radiculopathy, lumbar region: Secondary | ICD-10-CM | POA: Insufficient documentation

## 2024-02-19 MED ORDER — LIDOCAINE HCL 1 % IJ SOLN
5.0000 mL | Freq: Once | INTRAMUSCULAR | Status: AC
  Administered 2024-02-19: 5 mL

## 2024-02-19 MED ORDER — LIDOCAINE HCL (PF) 1 % IJ SOLN
2.0000 mL | Freq: Once | INTRAMUSCULAR | Status: AC
Start: 2024-02-19 — End: 2024-02-19
  Administered 2024-02-19: 2 mL

## 2024-02-19 MED ORDER — IOHEXOL 180 MG/ML  SOLN
3.0000 mL | Freq: Once | INTRAMUSCULAR | Status: AC
  Administered 2024-02-19: 3 mL

## 2024-02-19 MED ORDER — DEXAMETHASONE SODIUM PHOSPHATE 10 MG/ML IJ SOLN
10.0000 mg | Freq: Once | INTRAMUSCULAR | Status: AC
  Administered 2024-02-19: 10 mg

## 2024-02-19 NOTE — Progress Notes (Signed)
 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   Informed consent was obtained after describing risk and benefits of the procedure with the patient, this includes bleeding, bruising, infection, paralysis and medication side effects.  The patient wishes to proceed and has given written consent.  Patient was placed in prone position.  The lumbar area was marked and prepped with Betadine.  It was entered with a 25-gauge 1-1/2 inch needle and one mL of 1% lidocaine was injected into the skin and subcutaneous tissue.  Then a 22-gauge 3.5 inch spinal needle was inserted into the RIght L2-3 intervertebral foramen under AP, lateral, and oblique view.  Once needle tip was within the foramen on lateral views an dnor exceeding 6 o clock position on th epedical on AP viewed Isovue 200 was inected x 2ml Then a solution containing one mL of 10 mg per mL dexamethasone and 2 mL of 1% lidocaine was injected.  The patient tolerated procedure well.  Post procedure instructions were given.  Please see post procedure form.

## 2024-02-19 NOTE — Patient Instructions (Signed)

## 2024-03-12 ENCOUNTER — Other Ambulatory Visit: Payer: Self-pay | Admitting: Internal Medicine

## 2024-03-17 DIAGNOSIS — M25511 Pain in right shoulder: Secondary | ICD-10-CM | POA: Diagnosis not present

## 2024-03-19 DIAGNOSIS — M25411 Effusion, right shoulder: Secondary | ICD-10-CM | POA: Diagnosis not present

## 2024-03-24 DIAGNOSIS — M7551 Bursitis of right shoulder: Secondary | ICD-10-CM | POA: Diagnosis not present

## 2024-03-24 DIAGNOSIS — S46011A Strain of muscle(s) and tendon(s) of the rotator cuff of right shoulder, initial encounter: Secondary | ICD-10-CM | POA: Diagnosis not present

## 2024-04-01 ENCOUNTER — Encounter: Payer: 59 | Attending: Physical Medicine & Rehabilitation | Admitting: Physical Medicine & Rehabilitation

## 2024-04-01 DIAGNOSIS — M5416 Radiculopathy, lumbar region: Secondary | ICD-10-CM | POA: Insufficient documentation

## 2024-05-08 DIAGNOSIS — M5416 Radiculopathy, lumbar region: Secondary | ICD-10-CM | POA: Diagnosis not present

## 2024-05-08 DIAGNOSIS — Z981 Arthrodesis status: Secondary | ICD-10-CM | POA: Diagnosis not present

## 2024-05-08 DIAGNOSIS — M545 Low back pain, unspecified: Secondary | ICD-10-CM | POA: Diagnosis not present

## 2024-05-09 ENCOUNTER — Encounter: Admitting: Internal Medicine

## 2024-05-17 ENCOUNTER — Other Ambulatory Visit: Payer: Self-pay | Admitting: Internal Medicine

## 2024-05-21 ENCOUNTER — Encounter: Admitting: Internal Medicine

## 2024-06-11 ENCOUNTER — Other Ambulatory Visit: Payer: Self-pay | Admitting: Internal Medicine

## 2024-06-18 ENCOUNTER — Other Ambulatory Visit: Payer: Self-pay | Admitting: Internal Medicine

## 2024-07-30 ENCOUNTER — Encounter: Admitting: Internal Medicine

## 2024-08-07 ENCOUNTER — Ambulatory Visit: Admitting: Internal Medicine

## 2024-08-07 ENCOUNTER — Encounter: Payer: Self-pay | Admitting: Internal Medicine

## 2024-08-07 VITALS — BP 124/80 | HR 80 | Temp 98.2°F | Resp 18 | Ht 72.0 in | Wt 254.4 lb

## 2024-08-07 DIAGNOSIS — Z131 Encounter for screening for diabetes mellitus: Secondary | ICD-10-CM | POA: Diagnosis not present

## 2024-08-07 DIAGNOSIS — Z125 Encounter for screening for malignant neoplasm of prostate: Secondary | ICD-10-CM | POA: Insufficient documentation

## 2024-08-07 DIAGNOSIS — M5441 Lumbago with sciatica, right side: Secondary | ICD-10-CM | POA: Diagnosis not present

## 2024-08-07 DIAGNOSIS — K219 Gastro-esophageal reflux disease without esophagitis: Secondary | ICD-10-CM | POA: Diagnosis not present

## 2024-08-07 DIAGNOSIS — K635 Polyp of colon: Secondary | ICD-10-CM | POA: Insufficient documentation

## 2024-08-07 DIAGNOSIS — J309 Allergic rhinitis, unspecified: Secondary | ICD-10-CM | POA: Diagnosis not present

## 2024-08-07 DIAGNOSIS — I10 Essential (primary) hypertension: Secondary | ICD-10-CM | POA: Diagnosis not present

## 2024-08-07 DIAGNOSIS — Z6834 Body mass index (BMI) 34.0-34.9, adult: Secondary | ICD-10-CM | POA: Diagnosis not present

## 2024-08-07 DIAGNOSIS — Z0000 Encounter for general adult medical examination without abnormal findings: Secondary | ICD-10-CM

## 2024-08-07 DIAGNOSIS — G8929 Other chronic pain: Secondary | ICD-10-CM

## 2024-08-07 DIAGNOSIS — J4520 Mild intermittent asthma, uncomplicated: Secondary | ICD-10-CM | POA: Diagnosis not present

## 2024-08-07 DIAGNOSIS — Z720 Tobacco use: Secondary | ICD-10-CM

## 2024-08-07 MED ORDER — METHYLPREDNISOLONE 4 MG PO TBPK
ORAL_TABLET | ORAL | 0 refills | Status: DC
Start: 2024-08-07 — End: 2024-09-04

## 2024-08-07 MED ORDER — TETANUS-DIPHTHERIA TOXOIDS TD 2-2 LF/0.5ML IM SUSP: 0.5000 mL | Freq: Once | INTRAMUSCULAR | 0 refills | Status: AC

## 2024-08-07 MED ORDER — PREGABALIN 75 MG PO CAPS
75.0000 mg | ORAL_CAPSULE | Freq: Two times a day (BID) | ORAL | 1 refills | Status: DC
Start: 2024-08-07 — End: 2024-09-04

## 2024-08-07 NOTE — Assessment & Plan Note (Signed)
 Stable

## 2024-08-07 NOTE — Assessment & Plan Note (Signed)
 He need  repeat colonoscopy and I will also send tetanus booster to his pharmacy.

## 2024-08-07 NOTE — Assessment & Plan Note (Signed)
 Better

## 2024-08-07 NOTE — Progress Notes (Addendum)
 Office Visit  Subjective   Patient ID: Ricardo Yoder   DOB: Nov 01, 1966   Age: 58 y.o.   MRN: [id number]   Chief Complaint Chief Complaint  Patient presents with   Annual Exam     History of Present Illness 58 years old male is here for annual physical examination. He is married. He smoke 1/2 PPD and he does not drink. He says that he wanted to quit smoking. He has tried something but he did not tolerated that.   He has flu shot at [facility] last year. He did not have COVID vaccine. His tetanus booster was more than 10 years ago.   He denies any weak urine stream. He has colonoscopy but Dr. Charlanne and polyps were removed. He is due for repeat colonoscopy.   His mother died of breast cancer.   He takes amlodipine  and losartan -hydrochlorothiazide 100-25 mg daily for hypertension. He does not check his blood pressure at home.      He has acid reflux and he take omeprazole  40 mg daily.  He says that his symptoms get worse if he misses this medicine.  He has asthma and he use inhalor and his asthma symptoms are controlled.   He also has allergic rhinitis and take nasal spray but his symptoms are controlled.  He says his back pain is worse and it radiate to right heel and it feel like someone is stabbing him. He was given injection but that did not help.He says he fell few time because of back pain. He describe back pain as 10/10 in intensity. Pain is there when he sit or stand, it does not make any difference.   Past Medical History Past Medical History:  Diagnosis Date   Arthritis    back , knees   Asthma    as a child   Chronic back pain    GERD (gastroesophageal reflux disease)    Hypertension      Allergies Allergies  Allergen Reactions   Gabapentin Other (See Comments)    Makes him evil   Other Other (See Comments)    Muscle relaxers - Makes pt evil    Tramadol Other (See Comments)    Makes him evil     Review of Systems Review of Systems  Constitutional:  Negative.   HENT: Negative.    Respiratory: Negative.    Cardiovascular: Negative.   Gastrointestinal: Negative.   Musculoskeletal:  Positive for back pain.  Neurological: Negative.        Objective:    Vitals BP 124/80 (BP Location: Left Arm, Cuff Size: Normal)   Pulse 80   Temp 98.2 F (36.8 C)   Resp 18   Ht 6' (1.829 m)   Wt 254 lb 6 oz (115.4 kg)   SpO2 95%   BMI 34.50 kg/m    Physical Examination Physical Exam Constitutional:      Appearance: Normal appearance. He is obese.  HENT:     Head: Normocephalic and atraumatic.  Eyes:     Extraocular Movements: Extraocular movements intact.     Pupils: Pupils are equal, round, and reactive to light.  Cardiovascular:     Rate and Rhythm: Normal rate and regular rhythm.     Heart sounds: Normal heart sounds.  Pulmonary:     Effort: Pulmonary effort is normal.     Breath sounds: Normal breath sounds.  Abdominal:     General: Bowel sounds are normal.     Palpations: Abdomen is soft.  Neurological:  General: No focal deficit present.     Mental Status: He is alert and oriented to person, place, and time.        Assessment & Plan:   Primary hypertension   His blood pressure is well controlled.  Asthma   Stable.  Allergic rhinitis   Better  Polyp of colon  He is due for repeat colonoscopy and 1st 1 was done by Dr. Charlanne.  I will refer him to see Dr. Larene here in [place].  Gastroesophageal reflux disease   Continue with omeprazole  40 mg daily.  Chronic right-sided low back pain with right-sided sciatica   I will schedule the MRI of lumbar spine.  I will also give him Medrol  Dosepak.  I will also start him on gabapentin 300 mg 1 tablets daily for 3 days then 1 twice a day.  He wanted to be referred to [facility] pain clinic in [place] [phone].  Tobacco abuse  I will address it when his back pain is controlled.  Screening for prostate cancer   I will do PSA  Screening for diabetes mellitus  (DM)  I will do hemoglobin A1c.  General medical exam   He need  repeat colonoscopy and I will also send tetanus booster to his pharmacy.    Return in about 4 weeks (around 09/04/2024).   Roetta Dare, MD

## 2024-08-07 NOTE — Assessment & Plan Note (Signed)
 I will schedule the MRI of lumbar spine.  I will also give him Medrol  Dosepak.  I will also start him on gabapentin 300 mg 1 tablets daily for 3 days then 1 twice a day.  He wanted to be referred to [facility] pain clinic in [place] [phone].

## 2024-08-07 NOTE — Assessment & Plan Note (Signed)
 I will address it when his back pain is controlled.

## 2024-08-07 NOTE — Assessment & Plan Note (Signed)
 Continue with omeprazole 40 mg daily

## 2024-08-07 NOTE — Assessment & Plan Note (Signed)
I will do hemoglobin A1c.

## 2024-08-07 NOTE — Assessment & Plan Note (Signed)
 I will do PSA

## 2024-08-07 NOTE — Assessment & Plan Note (Signed)
 He is due for repeat colonoscopy and 1st 1 was done by Dr. Charlanne.  I will refer him to see Dr. Larene here in [place].

## 2024-08-07 NOTE — Assessment & Plan Note (Signed)
His blood pressure is well-controlled 

## 2024-08-08 ENCOUNTER — Ambulatory Visit: Payer: Self-pay

## 2024-08-08 ENCOUNTER — Other Ambulatory Visit: Payer: Self-pay | Admitting: Internal Medicine

## 2024-08-08 LAB — CBC WITH DIFFERENTIAL/PLATELET
Basophils Absolute: 0.1 x10E3/uL (ref 0.0–0.2)
Basos: 1 %
EOS (ABSOLUTE): 0.2 x10E3/uL (ref 0.0–0.4)
Eos: 3 %
Hematocrit: 47.2 % (ref 37.5–51.0)
Hemoglobin: 15.9 g/dL (ref 13.0–17.7)
Immature Grans (Abs): 0 x10E3/uL (ref 0.0–0.1)
Immature Granulocytes: 0 %
Lymphocytes Absolute: 2.2 x10E3/uL (ref 0.7–3.1)
Lymphs: 30 %
MCH: 32.9 pg (ref 26.6–33.0)
MCHC: 33.7 g/dL (ref 31.5–35.7)
MCV: 98 fL — ABNORMAL HIGH (ref 79–97)
Monocytes Absolute: 0.4 x10E3/uL (ref 0.1–0.9)
Monocytes: 5 %
Neutrophils Absolute: 4.6 x10E3/uL (ref 1.4–7.0)
Neutrophils: 61 %
Platelets: 258 x10E3/uL (ref 150–450)
RBC: 4.84 x10E6/uL (ref 4.14–5.80)
RDW: 12.8 % (ref 11.6–15.4)
WBC: 7.5 x10E3/uL (ref 3.4–10.8)

## 2024-08-08 LAB — LIPID PANEL
Chol/HDL Ratio: 4.4 ratio (ref 0.0–5.0)
Cholesterol, Total: 174 mg/dL (ref 100–199)
HDL: 40 mg/dL (ref >39)
LDL Chol Calc (NIH): 115 mg/dL — ABNORMAL HIGH (ref 0–99)
Triglycerides: 105 mg/dL (ref 0–149)
VLDL Cholesterol Cal: 19 mg/dL (ref 5–40)

## 2024-08-08 LAB — CMP14 + ANION GAP
ALT: 30 IU/L (ref 0–44)
AST: 29 IU/L (ref 0–40)
Albumin: 4.4 g/dL (ref 3.8–4.9)
Alkaline Phosphatase: 77 IU/L (ref 44–121)
Anion Gap: 18 mmol/L (ref 10.0–18.0)
BUN/Creatinine Ratio: 14 (ref 9–20)
BUN: 18 mg/dL (ref 6–24)
Bilirubin Total: 0.6 mg/dL (ref 0.0–1.2)
CO2: 27 mmol/L (ref 20–29)
Calcium: 9.5 mg/dL (ref 8.7–10.2)
Chloride: 95 mmol/L — ABNORMAL LOW (ref 96–106)
Creatinine, Ser: 1.25 mg/dL (ref 0.76–1.27)
Globulin, Total: 2.6 g/dL (ref 1.5–4.5)
Glucose: 95 mg/dL (ref 70–99)
Potassium: 3 mmol/L — ABNORMAL LOW (ref 3.5–5.2)
Sodium: 140 mmol/L (ref 134–144)
Total Protein: 7 g/dL (ref 6.0–8.5)
eGFR: 66 mL/min/1.73 (ref >59)

## 2024-08-08 LAB — PSA: Prostate Specific Ag, Serum: 0.5 ng/mL (ref 0.0–4.0)

## 2024-08-08 LAB — HEMOGLOBIN A1C
Est. average glucose Bld gHb Est-mCnc: 108 mg/dL
Hgb A1c MFr Bld: 5.4 % (ref 4.8–5.6)

## 2024-08-08 LAB — TSH: TSH: 1.16 u[IU]/mL (ref 0.450–4.500)

## 2024-08-08 LAB — VITAMIN D 25 HYDROXY (VIT D DEFICIENCY, FRACTURES): Vit D, 25-Hydroxy: 38.1 ng/mL (ref 30.0–100.0)

## 2024-08-08 MED ORDER — POTASSIUM CHLORIDE ER 20 MEQ PO TBCR: 2.0000 | EXTENDED_RELEASE_TABLET | Freq: Once | ORAL | 0 refills | Status: DC

## 2024-08-12 DIAGNOSIS — M5441 Lumbago with sciatica, right side: Secondary | ICD-10-CM | POA: Diagnosis not present

## 2024-08-13 DIAGNOSIS — M5116 Intervertebral disc disorders with radiculopathy, lumbar region: Secondary | ICD-10-CM | POA: Diagnosis not present

## 2024-08-13 DIAGNOSIS — Z981 Arthrodesis status: Secondary | ICD-10-CM | POA: Diagnosis not present

## 2024-08-13 DIAGNOSIS — M4726 Other spondylosis with radiculopathy, lumbar region: Secondary | ICD-10-CM | POA: Diagnosis not present

## 2024-08-15 ENCOUNTER — Other Ambulatory Visit: Payer: Self-pay

## 2024-08-19 ENCOUNTER — Other Ambulatory Visit: Payer: Self-pay | Admitting: Internal Medicine

## 2024-09-04 ENCOUNTER — Encounter: Payer: Self-pay | Admitting: Internal Medicine

## 2024-09-04 ENCOUNTER — Ambulatory Visit: Admitting: Internal Medicine

## 2024-09-04 VITALS — BP 124/78 | HR 76 | Temp 98.6°F | Resp 18 | Ht 74.0 in | Wt 255.0 lb

## 2024-09-04 DIAGNOSIS — Z23 Encounter for immunization: Secondary | ICD-10-CM | POA: Diagnosis not present

## 2024-09-04 DIAGNOSIS — M5441 Lumbago with sciatica, right side: Secondary | ICD-10-CM | POA: Diagnosis not present

## 2024-09-04 DIAGNOSIS — I10 Essential (primary) hypertension: Secondary | ICD-10-CM

## 2024-09-04 DIAGNOSIS — K219 Gastro-esophageal reflux disease without esophagitis: Secondary | ICD-10-CM | POA: Diagnosis not present

## 2024-09-04 DIAGNOSIS — G8929 Other chronic pain: Secondary | ICD-10-CM

## 2024-09-04 DIAGNOSIS — J4520 Mild intermittent asthma, uncomplicated: Secondary | ICD-10-CM | POA: Diagnosis not present

## 2024-09-04 DIAGNOSIS — Z0000 Encounter for general adult medical examination without abnormal findings: Secondary | ICD-10-CM

## 2024-09-04 MED ORDER — DICLOFENAC SODIUM 75 MG PO TBEC: 75.0000 mg | DELAYED_RELEASE_TABLET | Freq: Two times a day (BID) | ORAL | 1 refills | Status: DC

## 2024-09-04 MED ORDER — PREGABALIN 100 MG PO CAPS: 100.0000 mg | ORAL_CAPSULE | Freq: Two times a day (BID) | ORAL | 2 refills | Status: DC

## 2024-09-04 MED ORDER — AIRSUPRA 90-80 MCG/ACT IN AERO: 2.0000 | INHALATION_SPRAY | Freq: Four times a day (QID) | RESPIRATORY_TRACT | 5 refills | Status: AC | PRN

## 2024-09-04 MED ORDER — MONTELUKAST SODIUM 10 MG PO TABS: 10.0000 mg | ORAL_TABLET | Freq: Every day | ORAL | 0 refills | Status: DC

## 2024-09-04 NOTE — Assessment & Plan Note (Signed)
 Better

## 2024-09-04 NOTE — Assessment & Plan Note (Signed)
 I will start him on air supra 2 puff every 6 hour and Singulair  10 mg daily.  He will continue with Trelegy inhaler.

## 2024-09-04 NOTE — Progress Notes (Signed)
 Office Visit  Subjective   Patient ID: Corney Knighton   DOB: 11-30-1966   Age: 58 y.o.   MRN: [id number]   Chief Complaint Chief Complaint  Patient presents with   Follow-up    4 week follow up      History of Present Illness 58 years old male who is here for follow-up. He says that his back pain is  Not any better.  He says that he cannot dress cannot walk as pain get worse and it should to his right leg.  I have started him on diclofenac  sodium and pregabalin  but he says that that did not help.  He tells me that he could not sleep last night because of pain.  He described this pain as severe.    He has hypertension. He takes amlodipine  and losartan -hydrochlorothiazide 100-25 mg daily for hypertension. He does not check his blood pressure at home.       He has acid reflux and he take omeprazole  40 mg daily.  He says that his symptoms get worse if he misses this medicine.   He has asthma and he says that his symptoms are not controlled and he is having more shortness of breath he take Trelegy 1 puff daily.    He also has allergic rhinitis and take nasal spray but his symptoms are controlled.     Past Medical History Past Medical History:  Diagnosis Date   Arthritis    back , knees   Asthma    as a child   Chronic back pain    GERD (gastroesophageal reflux disease)    Hypertension      Allergies Allergies  Allergen Reactions   Gabapentin Other (See Comments)    Makes him evil   Other Other (See Comments)    Muscle relaxers - Makes pt evil    Tramadol Other (See Comments)    Makes him evil     Review of Systems Review of Systems  Constitutional: Negative.   Respiratory: Negative.    Cardiovascular: Negative.   Gastrointestinal: Negative.   Musculoskeletal:  Positive for back pain.  Neurological: Negative.        Objective:    Vitals BP 124/78   Pulse 76   Temp 98.6 F (37 C)   Resp 18   Ht 6' 2 (1.88 m)   Wt 255 lb (115.7 kg)   SpO2 94%    BMI 32.74 kg/m    Physical Examination Physical Exam Constitutional:      Appearance: Normal appearance. He is obese.  Cardiovascular:     Rate and Rhythm: Normal rate and regular rhythm.     Heart sounds: Normal heart sounds.  Pulmonary:     Effort: Pulmonary effort is normal.     Breath sounds: Normal breath sounds.  Abdominal:     Palpations: Abdomen is soft.  Neurological:     General: No focal deficit present.     Mental Status: He is alert and oriented to person, place, and time.        Assessment & Plan:   Primary hypertension   His back pain is controlled.  Asthma   I will start him on air supra 2 puff every 6 hour and Singulair  10 mg daily.  He will continue with Trelegy inhaler.  Chronic right-sided low back pain with right-sided sciatica   I will increase the dose of pregabalin  200 mg twice a day.  I will refer him to pain clinic when he  will let me know which Pain Clinic he wanted to go.  Gastroesophageal reflux disease   Better    Return in about 3 months (around 12/04/2024).   Roetta Dare, MD

## 2024-09-04 NOTE — Assessment & Plan Note (Signed)
 I will increase the dose of pregabalin  200 mg twice a day.  I will refer him to pain clinic when he will let me know which Pain Clinic he wanted to go.

## 2024-09-04 NOTE — Assessment & Plan Note (Signed)
His back pain is controlled.

## 2024-09-05 ENCOUNTER — Ambulatory Visit: Payer: Self-pay

## 2024-09-05 LAB — CMP14 + ANION GAP
ALT: 40 IU/L (ref 0–44)
AST: 37 IU/L (ref 0–40)
Albumin: 4.4 g/dL (ref 3.8–4.9)
Alkaline Phosphatase: 77 IU/L (ref 44–121)
Anion Gap: 14 mmol/L (ref 10.0–18.0)
BUN/Creatinine Ratio: 10 (ref 9–20)
BUN: 14 mg/dL (ref 6–24)
Bilirubin Total: 0.5 mg/dL (ref 0.0–1.2)
CO2: 28 mmol/L (ref 20–29)
Calcium: 9.7 mg/dL (ref 8.7–10.2)
Chloride: 94 mmol/L — ABNORMAL LOW (ref 96–106)
Creatinine, Ser: 1.4 mg/dL — ABNORMAL HIGH (ref 0.76–1.27)
Globulin, Total: 2.6 g/dL (ref 1.5–4.5)
Glucose: 92 mg/dL (ref 70–99)
Potassium: 3.7 mmol/L (ref 3.5–5.2)
Sodium: 136 mmol/L (ref 134–144)
Total Protein: 7 g/dL (ref 6.0–8.5)
eGFR: 58 mL/min/1.73 — ABNORMAL LOW (ref >59)

## 2024-09-05 NOTE — Addendum Note (Signed)
 Addended by: Harpreet Pompey on: 09/05/2024 03:40 PM   Modules accepted: Orders

## 2024-09-15 DIAGNOSIS — M7531 Calcific tendinitis of right shoulder: Secondary | ICD-10-CM | POA: Diagnosis not present

## 2024-09-15 DIAGNOSIS — M19011 Primary osteoarthritis, right shoulder: Secondary | ICD-10-CM | POA: Diagnosis not present

## 2024-09-29 ENCOUNTER — Other Ambulatory Visit: Payer: Self-pay | Admitting: Internal Medicine

## 2024-10-03 DIAGNOSIS — Z7951 Long term (current) use of inhaled steroids: Secondary | ICD-10-CM | POA: Diagnosis not present

## 2024-10-03 DIAGNOSIS — K0889 Other specified disorders of teeth and supporting structures: Secondary | ICD-10-CM | POA: Diagnosis not present

## 2024-10-03 DIAGNOSIS — Z79899 Other long term (current) drug therapy: Secondary | ICD-10-CM | POA: Diagnosis not present

## 2024-10-27 ENCOUNTER — Other Ambulatory Visit: Payer: Self-pay | Admitting: Internal Medicine

## 2024-10-27 DIAGNOSIS — H35412 Lattice degeneration of retina, left eye: Secondary | ICD-10-CM | POA: Diagnosis not present

## 2024-11-13 ENCOUNTER — Other Ambulatory Visit: Payer: Self-pay | Admitting: Internal Medicine

## 2024-11-24 ENCOUNTER — Ambulatory Visit

## 2024-11-24 VITALS — Ht 74.0 in | Wt 260.0 lb

## 2024-11-24 DIAGNOSIS — Z860100 Personal history of colon polyps, unspecified: Secondary | ICD-10-CM

## 2024-11-24 MED ORDER — NA SULFATE-K SULFATE-MG SULF 17.5-3.13-1.6 GM/177ML PO SOLN: 1.0000 | Freq: Once | ORAL | 0 refills | Status: AC

## 2024-11-24 NOTE — Progress Notes (Signed)

## 2024-11-28 ENCOUNTER — Other Ambulatory Visit: Payer: Self-pay | Admitting: Internal Medicine

## 2024-12-01 ENCOUNTER — Encounter: Payer: Self-pay | Admitting: Internal Medicine

## 2024-12-04 ENCOUNTER — Ambulatory Visit: Admitting: Internal Medicine

## 2024-12-04 ENCOUNTER — Encounter: Payer: Self-pay | Admitting: Internal Medicine

## 2024-12-04 ENCOUNTER — Other Ambulatory Visit: Payer: Self-pay | Admitting: Internal Medicine

## 2024-12-04 VITALS — BP 130/100 | HR 70 | Temp 98.7°F | Resp 18 | Ht 74.0 in | Wt 257.0 lb

## 2024-12-04 DIAGNOSIS — N529 Male erectile dysfunction, unspecified: Secondary | ICD-10-CM | POA: Diagnosis not present

## 2024-12-04 DIAGNOSIS — I10 Essential (primary) hypertension: Secondary | ICD-10-CM | POA: Diagnosis not present

## 2024-12-04 DIAGNOSIS — G8929 Other chronic pain: Secondary | ICD-10-CM | POA: Diagnosis not present

## 2024-12-04 DIAGNOSIS — J4520 Mild intermittent asthma, uncomplicated: Secondary | ICD-10-CM

## 2024-12-04 DIAGNOSIS — M25511 Pain in right shoulder: Secondary | ICD-10-CM | POA: Diagnosis not present

## 2024-12-04 DIAGNOSIS — Z0000 Encounter for general adult medical examination without abnormal findings: Secondary | ICD-10-CM | POA: Diagnosis not present

## 2024-12-04 DIAGNOSIS — M5441 Lumbago with sciatica, right side: Secondary | ICD-10-CM | POA: Diagnosis not present

## 2024-12-04 MED ORDER — PREGABALIN 75 MG PO CAPS: 75.0000 mg | ORAL_CAPSULE | Freq: Two times a day (BID) | ORAL | 2 refills | Status: AC

## 2024-12-04 MED ORDER — MONTELUKAST SODIUM 10 MG PO TABS: 10.0000 mg | ORAL_TABLET | Freq: Every day | ORAL | 0 refills | Status: DC

## 2024-12-04 NOTE — Progress Notes (Signed)
 Office Visit  Subjective   Patient ID: Ricardo Yoder   DOB: [birth year]   Age: 58 y.o.   MRN: [id number]   Chief Complaint Chief Complaint  Patient presents with   Follow-up    3 month     History of Present Illness 58 years old male who is here for follow-up.   He has hypertension and his blood pressure is 130/100 but he says that his blood pressure was 130/72 at home.  He takes amlodipine  10 mg daily and losartan  -hydrochlorothiazide 100 - 25 mg 1 tablets daily.  His potassium level was low and he was taking replacement but have not taken this medicine in a while.  I will repeat review labs today.    He is complaining of right shoulder pain and that is hurting a lot.  He says that he can not sleep on right side because of pain.  He take diclofenac  sodium as needed.  He also has low back pain that radiates to right leg.  I have prescribed him pregabalin  but he says that that makes him dizzy and lightheaded so he stopped taking that medicine.  He wanted to be referred to pain clinic and I have referred him but he says that nobody have contacted him.  He is asking if I can refer him to pain Clinic in [place].  Pain get worse when he stand up and walk.  Pain radiates to his right leg.   He has acid reflux and he take omeprazole  40 mg daily.  He says that his symptoms get worse if he misses this medicine.   He has asthma and he says that his symptoms are not controlled and he is having more shortness of breath he take Trelegy 1 puff daily.    He also has allergic rhinitis and take nasal spray but his symptoms are controlled.    He also asking for Viagra because of his nature problem.  Past Medical History Past Medical History:  Diagnosis Date   Arthritis    back , knees   Asthma    as a child   Chronic back pain    GERD (gastroesophageal reflux disease)    Hypertension      Allergies Allergies[1]   Review of Systems Review of Systems  Constitutional: Negative.    Respiratory: Negative.    Cardiovascular: Negative.   Musculoskeletal:  Positive for back pain and joint pain.  Neurological: Negative.        Objective:    Vitals BP (!) 130/100 (BP Location: Left Arm, Patient Position: Sitting, Cuff Size: Normal)   Pulse 70   Temp 98.7 F (37.1 C)   Resp 18   Ht 6' 2 (1.88 m)   Wt 257 lb (116.6 kg)   SpO2 96%   BMI 33.00 kg/m    Physical Examination Physical Exam Constitutional:      Appearance: Normal appearance. He is obese.  HENT:     Head: Normocephalic and atraumatic.  Eyes:     Extraocular Movements: Extraocular movements intact.     Pupils: Pupils are equal, round, and reactive to light.  Cardiovascular:     Rate and Rhythm: Normal rate and regular rhythm.     Heart sounds: Normal heart sounds.  Pulmonary:     Effort: Pulmonary effort is normal.     Breath sounds: Normal breath sounds.  Abdominal:     General: Bowel sounds are normal.     Palpations: Abdomen is soft.  Neurological:  General: No focal deficit present.     Mental Status: He is alert and oriented to person, place, and time.        Assessment & Plan:   Asthma   He will continue with Trelegy and air supra and I will add montelukast  10 mg daily.  Chronic right-sided low back pain with right-sided sciatica   He will continue with diclofenac  sodium twice a day as needed only and I will decrease the dose of pregabalin  75 mg twice a day.  I will refer him to pain Clinic in [place].  Erectile dysfunction   I will send sildenafil 100 mg as needed.  Acute pain of right shoulder   I will continue with diclofenac  sodium.    Return in about 3 months (around 03/04/2025).   Roetta Dare, MD      [1]  Allergies Allergen Reactions   Cyclobenzaprine Other (See Comments)    Makes me evil   Gabapentin Other (See Comments)    Makes him evil   Other Other (See Comments)    Muscle relaxers - Makes pt evil    Pregabalin  Nausea Only   Tramadol Other  (See Comments)    Makes him evil

## 2024-12-04 NOTE — Assessment & Plan Note (Signed)
 He will continue with diclofenac  sodium twice a day as needed only and I will decrease the dose of pregabalin  75 mg twice a day.  I will refer him to pain Clinic in [place].

## 2024-12-04 NOTE — Assessment & Plan Note (Signed)
 I will send sildenafil 100 mg as needed.

## 2024-12-04 NOTE — Assessment & Plan Note (Signed)
 He will continue with Trelegy and air supra and I will add montelukast  10 mg daily.

## 2024-12-04 NOTE — Assessment & Plan Note (Signed)
 I will continue with diclofenac  sodium.

## 2024-12-05 ENCOUNTER — Other Ambulatory Visit: Payer: Self-pay | Admitting: Internal Medicine

## 2024-12-05 ENCOUNTER — Ambulatory Visit: Payer: Self-pay

## 2024-12-05 LAB — CMP14 + ANION GAP
ALT: 38 IU/L (ref 0–44)
AST: 35 IU/L (ref 0–40)
Albumin: 4.6 g/dL (ref 3.8–4.9)
Alkaline Phosphatase: 71 IU/L (ref 47–123)
Anion Gap: 18 mmol/L (ref 10.0–18.0)
BUN/Creatinine Ratio: 11 (ref 9–20)
BUN: 13 mg/dL (ref 6–24)
Bilirubin Total: 0.6 mg/dL (ref 0.0–1.2)
CO2: 27 mmol/L (ref 20–29)
Calcium: 9.7 mg/dL (ref 8.7–10.2)
Chloride: 99 mmol/L (ref 96–106)
Creatinine, Ser: 1.19 mg/dL (ref 0.76–1.27)
Globulin, Total: 2.4 g/dL (ref 1.5–4.5)
Glucose: 110 mg/dL — ABNORMAL HIGH (ref 70–99)
Potassium: 3.1 mmol/L — ABNORMAL LOW (ref 3.5–5.2)
Sodium: 144 mmol/L (ref 134–144)
Total Protein: 7 g/dL (ref 6.0–8.5)
eGFR: 70 mL/min/1.73 (ref >59)

## 2024-12-05 NOTE — Progress Notes (Signed)
Patient called.  Left message for patient to call back.

## 2024-12-05 NOTE — Progress Notes (Signed)
 Patient called.  Patient aware. His potassium is low and he need to start taking one potassium tablet daily. Will repeat BMP in 2 weeks.

## 2024-12-08 ENCOUNTER — Encounter: Payer: Self-pay | Admitting: Internal Medicine

## 2024-12-08 ENCOUNTER — Ambulatory Visit: Admitting: Internal Medicine

## 2024-12-08 VITALS — BP 129/92 | HR 57 | Temp 98.1°F | Resp 14

## 2024-12-08 DIAGNOSIS — D124 Benign neoplasm of descending colon: Secondary | ICD-10-CM

## 2024-12-08 DIAGNOSIS — K635 Polyp of colon: Secondary | ICD-10-CM | POA: Diagnosis not present

## 2024-12-08 DIAGNOSIS — Z860100 Personal history of colon polyps, unspecified: Secondary | ICD-10-CM

## 2024-12-08 MED ORDER — SODIUM CHLORIDE 0.9 % IV SOLN: 500.0000 mL | Freq: Once | INTRAVENOUS | Status: DC

## 2024-12-08 NOTE — Progress Notes (Signed)
 Pt's states no medical or surgical changes since previsit or office visit.

## 2024-12-08 NOTE — Op Note (Signed)
 [facility] Patient Name: Ricardo Yoder Procedure Date: 12/08/2024 11:25 AM MRN: [id number] Endoscopist: Norleen SAILOR. Abran , MD, [id number] Age: 58 Referring MD:  Date of Birth: 1966/12/12 Gender: Male Account #: [id number] Procedure:                Colonoscopy with cold snare polypectomy x 2 Indications:              High risk colon cancer surveillance: Personal                            history of colonic polyps (remote, elsewhere, no                            details) Medicines:                Monitored Anesthesia Care Procedure:                Pre-Anesthesia Assessment:                           - Prior to the procedure, a History and Physical                            was performed, and patient medications and                            allergies were reviewed. The patient's tolerance of                            previous anesthesia was also reviewed. The risks                            and benefits of the procedure and the sedation                            options and risks were discussed with the patient.                            All questions were answered, and informed consent                            was obtained. Prior Anticoagulants: The patient has                            taken no anticoagulant or antiplatelet agents.                            After reviewing the risks and benefits, the patient                            was deemed in satisfactory condition to undergo the                            procedure.  After obtaining informed consent, the colonoscope                            was passed under direct vision. Throughout the                            procedure, the patient's blood pressure, pulse, and                            oxygen saturations were monitored continuously. The                            Olympus CF-HQ190L ([id number]) Colonoscope was                            introduced through the anus and advanced  to the the                            cecum, identified by appendiceal orifice and                            ileocecal valve. The ileocecal valve, appendiceal                            orifice, and rectum were photographed. The quality                            of the bowel preparation was good. The colonoscopy                            was performed without difficulty. The patient                            tolerated the procedure well. The bowel preparation                            used was SUPREP via split dose instruction. Scope In: 11:42:17 AM Scope Out: 11:56:09 AM Scope Withdrawal Time: 0 hours 11 minutes 38 seconds  Total Procedure Duration: 0 hours 13 minutes 52 seconds  Findings:                 Two polyps were found in the descending colon. The                            polyps were 2 to 4 mm in size. These polyps were                            removed with a cold snare. Resection and retrieval                            were complete.                           The exam was otherwise without abnormality  on                            direct and retroflexion views. Complications:            No immediate complications. Estimated blood loss:                            None. Estimated Blood Loss:     Estimated blood loss: none. Impression:               - Two 2 to 4 mm polyps in the descending colon,                            removed with a cold snare. Resected and retrieved.                           - The examination was otherwise normal on direct                            and retroflexion views. Recommendation:           - Repeat colonoscopy in 7-10 years for surveillance.                           - Patient has a contact number available for                            emergencies. The signs and symptoms of potential                            delayed complications were discussed with the                            patient. Return to normal activities tomorrow.                             Written discharge instructions were provided to the                            patient.                           - Resume previous diet.                           - Continue present medications.                           - Await pathology results. Norleen SAILOR. Abran, MD 12/08/2024 12:02:22 PM This report has been signed electronically.

## 2024-12-08 NOTE — Progress Notes (Signed)
 Report to PACU, RN, vss, BBS= Clear.

## 2024-12-08 NOTE — Patient Instructions (Signed)
 Handouts given: Polyps Resume previous diet. Continue present medications.  Await pathology results. Repeat colonoscopy in 7-10 years for surveillance.  YOU HAD AN ENDOSCOPIC PROCEDURE TODAY AT THE [facility]:   Refer to the procedure report that was given to you for any specific questions about what was found during the examination.  If the procedure report does not answer your questions, please call your gastroenterologist to clarify.  If you requested that your care partner not be given the details of your procedure findings, then the procedure report has been included in a sealed envelope for you to review at your convenience later.  YOU SHOULD EXPECT: Some feelings of bloating in the abdomen. Passage of more gas than usual.  Walking can help get rid of the air that was put into your GI tract during the procedure and reduce the bloating. If you had a lower endoscopy (such as a colonoscopy or flexible sigmoidoscopy) you may notice spotting of blood in your stool or on the toilet paper. If you underwent a bowel prep for your procedure, you may not have a normal bowel movement for a few days.  Please Note:  You might notice some irritation and congestion in your nose or some drainage.  This is from the oxygen used during your procedure.  There is no need for concern and it should clear up in a day or so.  SYMPTOMS TO REPORT IMMEDIATELY:  Following lower endoscopy (colonoscopy or flexible sigmoidoscopy):  Excessive amounts of blood in the stool  Significant tenderness or worsening of abdominal pains  Swelling of the abdomen that is new, acute  Fever of 100F or higher  For urgent or emergent issues, a gastroenterologist can be reached at any hour by calling [phone]. Do not use MyChart messaging for urgent concerns.    DIET:  We do recommend a small meal at first, but then you may proceed to your regular diet.  Drink plenty of fluids but you should avoid alcoholic  beverages for 24 hours.  ACTIVITY:  You should plan to take it easy for the rest of today and you should NOT DRIVE or use heavy machinery until tomorrow (because of the sedation medicines used during the test).    FOLLOW UP: Our staff will call the number listed on your records the next business day following your procedure.  We will call around 7:15- 8:00 am to check on you and address any questions or concerns that you may have regarding the information given to you following your procedure. If we do not reach you, we will leave a message.     If any biopsies were taken you will be contacted by phone or by letter within the next 1-3 weeks.  Please call us  at [phone] if you have not heard about the biopsies in 3 weeks.    SIGNATURES/CONFIDENTIALITY: You and/or your care partner have signed paperwork which will be entered into your electronic medical record.  These signatures attest to the fact that that the information above on your After Visit Summary has been reviewed and is understood.  Full responsibility of the confidentiality of this discharge information lies with you and/or your care-partner.

## 2024-12-08 NOTE — Progress Notes (Signed)
 Called to room to assist during endoscopic procedure.  Patient ID and intended procedure confirmed with present staff. Received instructions for my participation in the procedure from the performing physician.

## 2024-12-08 NOTE — Progress Notes (Signed)
 HISTORY OF PRESENT ILLNESS:  Ricardo Yoder is a 58 y.o. male sent today for colonoscopy.  Reportedly has a history of colon polyps.  REVIEW OF SYSTEMS:  All non-GI ROS negative except for  Past Medical History:  Diagnosis Date   Arthritis    back , knees   Asthma    as a child   Chronic back pain    GERD (gastroesophageal reflux disease)    Hypertension     Past Surgical History:  Procedure Laterality Date   BACK SURGERY     lumbar - x 5   COLONOSCOPY     KNEE ARTHROSCOPY Right    ROTATOR CUFF REPAIR Left    X2 right    Social History Ricardo Yoder  reports that he has been smoking cigarettes. He has never used smokeless tobacco. He reports that he does not currently use alcohol. He reports that he does not use drugs.  family history includes Cancer in his mother.  Allergies[1]     PHYSICAL EXAMINATION: Vital signs: Pulse 66   Temp 98.1 F (36.7 C)   Resp 11   SpO2 97%  General: Well-developed, well-nourished, no acute distress HEENT: Sclerae are anicteric, conjunctiva pink. Oral mucosa intact Lungs: Clear Heart: Regular Abdomen: soft, nontender, nondistended, no obvious ascites, no peritoneal signs, normal bowel sounds. No organomegaly. Extremities: No edema Psychiatric: alert and oriented x3. Cooperative     ASSESSMENT:  History of polyps   PLAN:  Colonoscopy         [1]  Allergies Allergen Reactions   Cyclobenzaprine Other (See Comments)    Makes me evil   Gabapentin Other (See Comments)    Makes him evil   Other Other (See Comments)    Muscle relaxers - Makes pt evil    Pregabalin  Nausea Only   Tramadol Other (See Comments)    Makes him evil

## 2024-12-09 ENCOUNTER — Other Ambulatory Visit: Payer: Self-pay | Admitting: Internal Medicine

## 2024-12-09 ENCOUNTER — Telehealth: Payer: Self-pay | Admitting: *Deleted

## 2024-12-09 ENCOUNTER — Other Ambulatory Visit: Payer: Self-pay

## 2024-12-09 MED ORDER — POTASSIUM CHLORIDE ER 20 MEQ PO TBCR: 1.0000 | EXTENDED_RELEASE_TABLET | Freq: Every day | ORAL | 2 refills | Status: AC

## 2024-12-09 NOTE — Telephone Encounter (Signed)
°  Follow up Call-     12/08/2024   10:33 AM  Call back number  Post procedure Call Back phone  # [phone]  Permission to leave phone message Yes     Patient questions:  Do you have a fever, pain , or abdominal swelling? No. Pain Score  0 *  Have you tolerated food without any problems? Yes.    Have you been able to return to your normal activities? Yes.    Do you have any questions about your discharge instructions: Diet   No. Medications  No. Follow up visit  No.  Do you have questions or concerns about your Care? No.  Actions: * If pain score is 4 or above: No action needed, pain <4.

## 2024-12-11 LAB — SURGICAL PATHOLOGY

## 2024-12-17 ENCOUNTER — Ambulatory Visit: Payer: Self-pay | Admitting: Internal Medicine

## 2024-12-21 ENCOUNTER — Other Ambulatory Visit: Payer: Self-pay | Admitting: Internal Medicine

## 2024-12-29 ENCOUNTER — Other Ambulatory Visit: Payer: Self-pay | Admitting: Internal Medicine

## 2025-01-21 ENCOUNTER — Ambulatory Visit: Admitting: Internal Medicine

## 2025-01-27 ENCOUNTER — Other Ambulatory Visit: Payer: Self-pay | Admitting: Internal Medicine

## 2025-02-03 ENCOUNTER — Ambulatory Visit: Admitting: Internal Medicine

## 2025-02-04 ENCOUNTER — Ambulatory Visit: Admitting: Internal Medicine

## 2025-03-04 ENCOUNTER — Ambulatory Visit: Admitting: Internal Medicine
